# Patient Record
Sex: Male | Born: 1950 | Race: White | Hispanic: No | Marital: Married | Smoking: Former smoker
Health system: Southern US, Community
[De-identification: ages and names within clinical notes are randomized; demographics above are authoritative.]

## PROBLEM LIST (undated history)

## (undated) DIAGNOSIS — H9192 Unspecified hearing loss, left ear: Secondary | ICD-10-CM

## (undated) DIAGNOSIS — Z8042 Family history of malignant neoplasm of prostate: Secondary | ICD-10-CM

## (undated) DIAGNOSIS — M1990 Unspecified osteoarthritis, unspecified site: Secondary | ICD-10-CM

## (undated) DIAGNOSIS — K219 Gastro-esophageal reflux disease without esophagitis: Secondary | ICD-10-CM

## (undated) DIAGNOSIS — E785 Hyperlipidemia, unspecified: Secondary | ICD-10-CM

## (undated) DIAGNOSIS — Z800 Family history of malignant neoplasm of digestive organs: Secondary | ICD-10-CM

## (undated) DIAGNOSIS — I10 Essential (primary) hypertension: Secondary | ICD-10-CM

## (undated) DIAGNOSIS — C801 Malignant (primary) neoplasm, unspecified: Secondary | ICD-10-CM

## (undated) DIAGNOSIS — Z801 Family history of malignant neoplasm of trachea, bronchus and lung: Secondary | ICD-10-CM

## (undated) DIAGNOSIS — Z808 Family history of malignant neoplasm of other organs or systems: Secondary | ICD-10-CM

## (undated) HISTORY — DX: Family history of malignant neoplasm of prostate: Z80.42

## (undated) HISTORY — DX: Family history of malignant neoplasm of trachea, bronchus and lung: Z80.1

## (undated) HISTORY — DX: Family history of malignant neoplasm of digestive organs: Z80.0

## (undated) HISTORY — DX: Essential (primary) hypertension: I10

## (undated) HISTORY — DX: Hyperlipidemia, unspecified: E78.5

## (undated) HISTORY — DX: Family history of malignant neoplasm of other organs or systems: Z80.8

---

## 2006-06-03 HISTORY — PX: FINGER SURGERY: SHX640

## 2010-06-06 DIAGNOSIS — S64490A Injury of digital nerve of right index finger, initial encounter: Secondary | ICD-10-CM | POA: Insufficient documentation

## 2010-06-06 DIAGNOSIS — S67190A Crushing injury of right index finger, initial encounter: Secondary | ICD-10-CM | POA: Insufficient documentation

## 2010-07-31 DIAGNOSIS — G5600 Carpal tunnel syndrome, unspecified upper limb: Secondary | ICD-10-CM | POA: Insufficient documentation

## 2013-12-17 ENCOUNTER — Ambulatory Visit: Payer: Self-pay | Admitting: Internal Medicine

## 2015-08-03 ENCOUNTER — Encounter: Payer: Self-pay | Admitting: *Deleted

## 2015-08-14 ENCOUNTER — Ambulatory Visit (INDEPENDENT_AMBULATORY_CARE_PROVIDER_SITE_OTHER): Payer: Managed Care, Other (non HMO) | Admitting: General Surgery

## 2015-08-14 ENCOUNTER — Encounter: Payer: Self-pay | Admitting: General Surgery

## 2015-08-14 DIAGNOSIS — D1720 Benign lipomatous neoplasm of skin and subcutaneous tissue of unspecified limb: Secondary | ICD-10-CM

## 2015-08-14 DIAGNOSIS — Z8371 Family history of colonic polyps: Secondary | ICD-10-CM

## 2015-08-14 DIAGNOSIS — Z86010 Personal history of colonic polyps: Secondary | ICD-10-CM | POA: Diagnosis not present

## 2015-08-14 MED ORDER — POLYETHYLENE GLYCOL 3350 17 GM/SCOOP PO POWD: ORAL | Status: DC

## 2015-08-14 NOTE — Progress Notes (Signed)
Patient ID: Lee Graham, male   DOB: 08/16/1950, 65 y.o.   MRN: [id number]  Chief Complaint  Patient presents with  . Colonoscopy    HPI Lee Graham is a 65 y.o. male here today for a evaluation of colonoscopy. Last colonoscopy was done 8 years ago with a finding of adenomatous polyps. Patient states no GI problems at this time. Current health conditions include high cholesterol and heart issues. Family history includes colon cancer in mother and 2 brothers. Father had prostate cancer. Oldest brother had facial cancer.  HPI I have reviewed the history of present illness with the patient.   Past Medical History  Diagnosis Date  . Hyperlipidemia   . Hypertension     History reviewed. No pertinent past surgical history.  Family History  Problem Relation Age of Onset  . Colon cancer Father     Social History Social History  Substance Use Topics  . Smoking status: Never Smoker   . Smokeless tobacco: None  . Alcohol Use: No    No Known Allergies  Current Outpatient Prescriptions  Medication Sig Dispense Refill  . aspirin 81 MG tablet Take 81 mg by mouth daily.    . metoprolol succinate (TOPROL-XL) 100 MG 24 hr tablet     . simvastatin (ZOCOR) 40 MG tablet     . telmisartan (MICARDIS) 40 MG tablet     . polyethylene glycol powder (GLYCOLAX/MIRALAX) powder 255 grams one bottle for colonoscopy prep 255 g 0   No current facility-administered medications for this visit.    Review of Systems Review of Systems  Constitutional: Negative.   Respiratory: Negative.   Cardiovascular: Negative.   Gastrointestinal: Negative.     Blood pressure 120/70, pulse 68, resp. rate 12, height 6\' 2"  (1.88 m), weight 229 lb (103.874 kg).  Physical Exam Physical Exam  Constitutional: He is oriented to person, place, and time. He appears well-developed and well-nourished.  Eyes: Conjunctivae are normal. No scleral icterus.  Neck: Neck supple.  Cardiovascular: Normal rate,  regular rhythm and normal heart sounds.   Pulmonary/Chest: Effort normal and breath sounds normal.    Abdominal: Soft. Bowel sounds are normal. There is no hepatomegaly. There is no tenderness. No hernia.  Lymphadenopathy:    He has no cervical adenopathy.  Neurological: He is alert and oriented to person, place, and time.  Skin: Skin is warm and dry.  Psychiatric: His behavior is normal.    Data Reviewed 2009 colonoscopy reviewed.  Assessment    Stable exam. 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 Pt reports this has been there for years and has grown slowly.     Plan    Colonoscopy with possible biopsy/polypectomy prn: Information regarding the procedure, including its potential risks and complications (including but not limited to perforation of the bowel, which may require emergency surgery to repair, and bleeding) was verbally given to the patient. Educational information regarding lower intestinal endoscopy was given to the patient. Written instructions for how to complete the bowel prep using Miralax were provided. The importance of drinking ample fluids to avoid dehydration as a result of the prep emphasized.     Patient has been scheduled for a colonoscopy on 08-30-15 at [facility]. It is okay for patient to continue 81 mg aspirin once daily.   Also, patient has been scheduled for an excision of the right shoulder on 10-09-15 at [facility]. Patient may continue 81 mg aspirin once daily.     Roosvelt Churchwell G 08/14/2015, 1:16  PM

## 2015-08-14 NOTE — Patient Instructions (Addendum)
Colonoscopy A colonoscopy is an exam to look at the entire large intestine (colon). This exam can help find problems such as tumors, polyps, inflammation, and areas of bleeding. The exam takes about 1 hour.  LET [facility] CARE PROVIDER KNOW ABOUT:   Any allergies you have.  All medicines you are taking, including vitamins, herbs, eye drops, creams, and over-the-counter medicines.  Previous problems you or members of your family have had with the use of anesthetics.  Any blood disorders you have.  Previous surgeries you have had.  Medical conditions you have. RISKS AND COMPLICATIONS  Generally, this is a safe procedure. However, as with any procedure, complications can occur. Possible complications include:  Bleeding.  Tearing or rupture of the colon wall.  Reaction to medicines given during the exam.  Infection (rare). BEFORE THE PROCEDURE   Ask your health care provider about changing or stopping your regular medicines.  You may be prescribed an oral bowel prep. This involves drinking a large amount of medicated liquid, starting the day before your procedure. The liquid will cause you to have multiple loose stools until your stool is almost clear or light green. This cleans out your colon in preparation for the procedure.  Do not eat or drink anything else once you have started the bowel prep, unless your health care provider tells you it is safe to do so.  Arrange for someone to drive you home after the procedure. PROCEDURE   You will be given medicine to help you relax (sedative).  You will lie on your side with your knees bent.  A long, flexible tube with a light and camera on the end (colonoscope) will be inserted through the rectum and into the colon. The camera sends video back to a computer screen as it moves through the colon. The colonoscope also releases carbon dioxide gas to inflate the colon. This helps your health care provider see the area better.  During  the exam, your health care provider may take a small tissue sample (biopsy) to be examined under a microscope if any abnormalities are found.  The exam is finished when the entire colon has been viewed. AFTER THE PROCEDURE   Do not drive for 24 hours after the exam.  You may have a small amount of blood in your stool.  You may pass moderate amounts of gas and have mild abdominal cramping or bloating. This is caused by the gas used to inflate your colon during the exam.  Ask when your test results will be ready and how you will get your results. Make sure you get your test results.   This information is not intended to replace advice given to you by your health care provider. Make sure you discuss any questions you have with your health care provider.   Document Released: 05/17/2000 Document Revised: 03/10/2013 Document Reviewed: 01/25/2013 Elsevier Interactive Patient Education [employer].  Patient has been scheduled for a colonoscopy on 08-30-15 at [facility]. It is okay for patient to continue 81 mg aspirin once daily.   Also, patient has been scheduled for an excision of the right shoulder on 10-09-15 at [facility]. Patient may continue 81 mg aspirin once daily.

## 2015-08-29 ENCOUNTER — Encounter: Payer: Self-pay | Admitting: *Deleted

## 2015-08-30 ENCOUNTER — Ambulatory Visit: Payer: Managed Care, Other (non HMO) | Admitting: Anesthesiology

## 2015-08-30 ENCOUNTER — Encounter: Payer: Self-pay | Admitting: Anesthesiology

## 2015-08-30 ENCOUNTER — Ambulatory Visit
Admission: RE | Admit: 2015-08-30 | Discharge: 2015-08-30 | Disposition: A | Discharge status: Home / Self Care | Payer: Managed Care, Other (non HMO) | Source: Ambulatory Visit | Attending: General Surgery | Admitting: General Surgery

## 2015-08-30 ENCOUNTER — Encounter
Admission: RE | Disposition: A | Discharge status: Home / Self Care | Payer: Self-pay | Source: Ambulatory Visit | Attending: General Surgery

## 2015-08-30 DIAGNOSIS — R2231 Localized swelling, mass and lump, right upper limb: Secondary | ICD-10-CM | POA: Diagnosis not present

## 2015-08-30 DIAGNOSIS — I10 Essential (primary) hypertension: Secondary | ICD-10-CM | POA: Insufficient documentation

## 2015-08-30 DIAGNOSIS — Z86010 Personal history of colonic polyps: Secondary | ICD-10-CM | POA: Insufficient documentation

## 2015-08-30 DIAGNOSIS — Z7982 Long term (current) use of aspirin: Secondary | ICD-10-CM | POA: Insufficient documentation

## 2015-08-30 DIAGNOSIS — E7800 Pure hypercholesterolemia, unspecified: Secondary | ICD-10-CM | POA: Diagnosis not present

## 2015-08-30 DIAGNOSIS — Z1211 Encounter for screening for malignant neoplasm of colon: Secondary | ICD-10-CM | POA: Diagnosis not present

## 2015-08-30 DIAGNOSIS — Z800 Family history of malignant neoplasm of digestive organs: Secondary | ICD-10-CM | POA: Insufficient documentation

## 2015-08-30 DIAGNOSIS — Z8042 Family history of malignant neoplasm of prostate: Secondary | ICD-10-CM | POA: Diagnosis not present

## 2015-08-30 DIAGNOSIS — K219 Gastro-esophageal reflux disease without esophagitis: Secondary | ICD-10-CM | POA: Diagnosis not present

## 2015-08-30 DIAGNOSIS — E785 Hyperlipidemia, unspecified: Secondary | ICD-10-CM | POA: Diagnosis not present

## 2015-08-30 DIAGNOSIS — Z79899 Other long term (current) drug therapy: Secondary | ICD-10-CM | POA: Insufficient documentation

## 2015-08-30 HISTORY — PX: COLONOSCOPY WITH PROPOFOL: SHX5780

## 2015-08-30 SURGERY — COLONOSCOPY WITH PROPOFOL
Anesthesia: General

## 2015-08-30 MED ORDER — PROPOFOL 10 MG/ML IV BOLUS
INTRAVENOUS | Status: DC | PRN
  Administered 2015-08-30: 70 mg via INTRAVENOUS

## 2015-08-30 MED ORDER — SODIUM CHLORIDE 0.9 % IV SOLN
INTRAVENOUS | Status: DC
  Administered 2015-08-30: 1000 mL via INTRAVENOUS

## 2015-08-30 MED ORDER — PROPOFOL 500 MG/50ML IV EMUL
INTRAVENOUS | Status: DC | PRN
  Administered 2015-08-30: 125 ug/kg/min via INTRAVENOUS

## 2015-08-30 MED ORDER — LIDOCAINE HCL (PF) 2 % IJ SOLN
INTRAMUSCULAR | Status: DC | PRN
  Administered 2015-08-30: 80 mg via INTRADERMAL

## 2015-08-30 NOTE — H&P (View-Only) (Signed)
Patient ID: Lee Graham, male   DOB: 26-Sep-1950, 65 y.o.   MRN: [id number]  Chief Complaint  Patient presents with  . Colonoscopy    HPI Lee Graham is a 65 y.o. male here today for a evaluation of colonoscopy. Last colonoscopy was done 8 years ago with a finding of adenomatous polyps. Patient states no GI problems at this time. Current health conditions include high cholesterol and heart issues. Family history includes colon cancer in mother and 2 brothers. Father had prostate cancer. Oldest brother had facial cancer.  HPI I have reviewed the history of present illness with the patient.   Past Medical History  Diagnosis Date  . Hyperlipidemia   . Hypertension     History reviewed. No pertinent past surgical history.  Family History  Problem Relation Age of Onset  . Colon cancer Father     Social History Social History  Substance Use Topics  . Smoking status: Never Smoker   . Smokeless tobacco: None  . Alcohol Use: No    No Known Allergies  Current Outpatient Prescriptions  Medication Sig Dispense Refill  . aspirin 81 MG tablet Take 81 mg by mouth daily.    . metoprolol succinate (TOPROL-XL) 100 MG 24 hr tablet     . simvastatin (ZOCOR) 40 MG tablet     . telmisartan (MICARDIS) 40 MG tablet     . polyethylene glycol powder (GLYCOLAX/MIRALAX) powder 255 grams one bottle for colonoscopy prep 255 g 0   No current facility-administered medications for this visit.    Review of Systems Review of Systems  Constitutional: Negative.   Respiratory: Negative.   Cardiovascular: Negative.   Gastrointestinal: Negative.     Blood pressure 120/70, pulse 68, resp. rate 12, height 6\' 2"  (1.88 m), weight 229 lb (103.874 kg).  Physical Exam Physical Exam  Constitutional: He is oriented to person, place, and time. He appears well-developed and well-nourished.  Eyes: Conjunctivae are normal. No scleral icterus.  Neck: Neck supple.  Cardiovascular: Normal rate,  regular rhythm and normal heart sounds.   Pulmonary/Chest: Effort normal and breath sounds normal.    Abdominal: Soft. Bowel sounds are normal. There is no hepatomegaly. There is no tenderness. No hernia.  Lymphadenopathy:    He has no cervical adenopathy.  Neurological: He is alert and oriented to person, place, and time.  Skin: Skin is warm and dry.  Psychiatric: His behavior is normal.    Data Reviewed 2009 colonoscopy reviewed.  Assessment    Stable exam. 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 Pt reports this has been there for years and has grown slowly.     Plan    Colonoscopy with possible biopsy/polypectomy prn: Information regarding the procedure, including its potential risks and complications (including but not limited to perforation of the bowel, which may require emergency surgery to repair, and bleeding) was verbally given to the patient. Educational information regarding lower intestinal endoscopy was given to the patient. Written instructions for how to complete the bowel prep using Miralax were provided. The importance of drinking ample fluids to avoid dehydration as a result of the prep emphasized.     Patient has been scheduled for a colonoscopy on 08-30-15 at [facility]. It is okay for patient to continue 81 mg aspirin once daily.   Also, patient has been scheduled for an excision of the right shoulder on 10-09-15 at [facility]. Patient may continue 81 mg aspirin once daily.     Kyle Stansell G 08/14/2015, 1:16  PM

## 2015-08-30 NOTE — Anesthesia Postprocedure Evaluation (Signed)
Anesthesia Post Note  Patient: Lee Graham  Procedure(s) Performed: Procedure(s) (LRB): COLONOSCOPY WITH PROPOFOL (N/A)  Patient location during evaluation: Endoscopy Anesthesia Type: General Level of consciousness: awake and alert Pain management: pain level controlled Vital Signs Assessment: post-procedure vital signs reviewed and stable Respiratory status: spontaneous breathing, nonlabored ventilation, respiratory function stable and patient connected to nasal cannula oxygen Cardiovascular status: blood pressure returned to baseline and stable Postop Assessment: no signs of nausea or vomiting Anesthetic complications: no    Last Vitals:  Filed Vitals:   08/30/15 1220 08/30/15 1229  BP:  127/61  Pulse: 61 52  Temp:    Resp: 20 13    Last Pain: There were no vitals filed for this visit.               Precious Haws Piscitello

## 2015-08-30 NOTE — Op Note (Signed)
[facility] Gastroenterology Patient Name: Lee Graham Procedure Date: 08/30/2015 11:38 AM MRN: [id number] Account #: [id number] Date of Birth: March 28, 1951 Admit Type: Outpatient Age: 65 Room: [facility] ENDO ROOM 1 Gender: Male Note Status: Finalized Procedure:            Colonoscopy Indications:          Family history of colon cancer in a first-degree                        relative Providers:            Seeplaputhur G. Jamal Collin, MD Referring MD:         Rusty Aus, MD (Referring MD) Medicines:            General Anesthesia Complications:        No immediate complications. Procedure:            Pre-Anesthesia Assessment:                       - General anesthesia under the supervision of an                        anesthesiologist was determined to be medically                        necessary for this procedure based on review of the                        patient's medical history, medications, and prior                        anesthesia history.                       After obtaining informed consent, the colonoscope was                        passed under direct vision. Throughout the procedure,                        the patient's blood pressure, pulse, and oxygen                        saturations were monitored continuously. The                        Colonoscope was introduced through the anus and                        advanced to the the cecum, identified by the ileocecal                        valve. The colonoscopy was performed without                        difficulty. The patient tolerated the procedure well.                        The quality of the bowel preparation was good. Findings:      The entire examined colon appeared normal on direct and retroflexion  views. Impression:           - The entire examined colon is normal on direct and                        retroflexion views.                       - No specimens  collected. Recommendation:       - Discharge patient to home.                       - Repeat colonoscopy in 5 years for surveillance. Procedure Code(s):    --- Professional ---                       [phone], Colonoscopy, flexible; diagnostic, including                        collection of specimen(s) by brushing or washing, when                        performed (separate procedure) Diagnosis Code(s):    --- Professional ---                       Z80.0, Family history of malignant neoplasm of                        digestive organs CPT copyright 2016 American Medical Association. All rights reserved. The codes documented in this report are preliminary and upon coder review may  be revised to meet current compliance requirements. Christene Lye, MD 08/30/2015 12:05:36 PM This report has been signed electronically. Number of Addenda: 0 Note Initiated On: 08/30/2015 11:38 AM Scope Withdrawal Time: 0 hours 4 minutes 44 seconds  Total Procedure Duration: 0 hours 12 minutes 45 seconds       [facility]

## 2015-08-30 NOTE — Anesthesia Preprocedure Evaluation (Signed)
Anesthesia Evaluation  Patient identified by MRN, date of birth, ID band Patient awake    Reviewed: Allergy & Precautions, H&P , NPO status , Patient's Chart, lab work & pertinent test results  History of Anesthesia Complications Negative for: history of anesthetic complications  Airway Mallampati: III  TM Distance: >3 FB Neck ROM: limited    Dental  (+) Poor Dentition, Chipped, Missing, Partial Lower, Partial Upper   Pulmonary neg pulmonary ROS, neg shortness of breath,    Pulmonary exam normal breath sounds clear to auscultation       Cardiovascular Exercise Tolerance: Good hypertension, (-) angina(-) Past MI and (-) DOE Normal cardiovascular exam Rhythm:regular Rate:Normal     Neuro/Psych negative neurological ROS  negative psych ROS   GI/Hepatic Neg liver ROS, GERD  Controlled,  Endo/Other  negative endocrine ROS  Renal/GU negative Renal ROS  negative genitourinary   Musculoskeletal   Abdominal   Peds  Hematology negative hematology ROS (+)   Anesthesia Other Findings Past Medical History:   Hyperlipidemia                                               Hypertension                                                History reviewed. No pertinent surgical history.  BMI    Body Mass Index   27.87 kg/m 2    Signs and symptoms suggestive of sleep apnea    Reproductive/Obstetrics negative OB ROS                             Anesthesia Physical Anesthesia Plan  ASA: III  Anesthesia Plan: General   Post-op Pain Management:    Induction:   Airway Management Planned:   Additional Equipment:   Intra-op Plan:   Post-operative Plan:   Informed Consent: I have reviewed the patients History and Physical, chart, labs and discussed the procedure including the risks, benefits and alternatives for the proposed anesthesia with the patient or authorized representative who has indicated  his/her understanding and acceptance.   Dental Advisory Given  Plan Discussed with: Anesthesiologist, CRNA and Surgeon  Anesthesia Plan Comments:         Anesthesia Quick Evaluation

## 2015-08-30 NOTE — Transfer of Care (Signed)
Immediate Anesthesia Transfer of Care Note  Patient: Lee Graham  Procedure(s) Performed: Procedure(s): COLONOSCOPY WITH PROPOFOL (N/A)  Patient Location: PACU  Anesthesia Type:General  Level of Consciousness: sedated and responds to stimulation  Airway & Oxygen Therapy: Patient Spontanous Breathing and Patient connected to nasal cannula oxygen  Post-op Assessment: Report given to RN and Post -op Vital signs reviewed and stable  Post vital signs: Reviewed and stable  Last Vitals:  Filed Vitals:   08/30/15 1010 08/30/15 1206  BP: 115/66 124/68  Pulse: 57 63  Temp: 36.4 C   Resp: 16 13    Complications: No apparent anesthesia complications

## 2015-08-30 NOTE — Interval H&P Note (Signed)
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 After consideration of risks, benefits and other options for treatment, the patient has consented to  Procedure(s): COLONOSCOPY WITH PROPOFOL (N/A) as a surgical intervention .  The patient's history has been reviewed, patient examined, no change in status, stable for surgery.  I have reviewed the patient's chart and labs.  Questions were answered to the patient's satisfaction.     SANKAR,SEEPLAPUTHUR G

## 2015-09-01 ENCOUNTER — Encounter: Payer: Self-pay | Admitting: General Surgery

## 2015-09-21 ENCOUNTER — Other Ambulatory Visit: Payer: Self-pay | Admitting: General Surgery

## 2015-09-21 DIAGNOSIS — D1720 Benign lipomatous neoplasm of skin and subcutaneous tissue of unspecified limb: Secondary | ICD-10-CM

## 2015-09-29 ENCOUNTER — Other Ambulatory Visit: Payer: Self-pay

## 2015-09-29 ENCOUNTER — Encounter: Payer: Self-pay | Admitting: *Deleted

## 2015-09-29 NOTE — Patient Instructions (Signed)
  Your procedure is scheduled on: 10-09-15  Report to [place] To find out your arrival time please call [phone] between 1PM - 3PM on 10-06-15  Remember: Instructions that are not followed completely may result in serious medical risk, up to and including death, or upon the discretion of your surgeon and anesthesiologist your surgery may need to be rescheduled.    _X___ 1. Do not eat food or drink liquids after midnight. No gum chewing or hard candies.     _X___ 2. No Alcohol for 24 hours before or after surgery.   ____ 3. Bring all medications with you on the day of surgery if instructed.    ____ 4. Notify your doctor if there is any change in your medical condition     (cold, fever, infections).     Do not wear jewelry, make-up, hairpins, clips or nail polish.  Do not wear lotions, powders, or perfumes. You may wear deodorant.  Do not shave 48 hours prior to surgery. Men may shave face and neck.  Do not bring valuables to the hospital.    [facility] is not responsible for any belongings or valuables.               Contacts, dentures or bridgework may not be worn into surgery.  Leave your suitcase in the car. After surgery it may be brought to your room.  For patients admitted to the hospital, discharge time is determined by your  treatment team.   Patients discharged the day of surgery will not be allowed to drive home.   Please read over the following fact sheets that you were given:      _X___ Take these medicines the morning of surgery with A SIP OF WATER:    1. MICARDIS  2. METOPROLOL  3.   4.  5.  6.  ____ Fleet Enema (as directed)   ____ Use CHG Soap as directed  ____ Use inhalers on the day of surgery  ____ Stop metformin 2 days prior to surgery    ____ Take 1/2 of usual insulin dose the night before surgery and none on the morning of surgery.   ____ Stop Coumadin/Plavix/aspirin-OK TO CONTINUE 81 MG ASA PER DR Jamal Collin  ____  Stop Anti-inflammatories-NO NSAIDS OR ASA PRODUCTS-TYLENOL OK TO TAKE   ____ Stop supplements until after surgery.    ____ Bring C-Pap to the hospital.

## 2015-10-03 NOTE — Pre-Procedure Instructions (Signed)
PTS WIFE CALLED BACK SO I COULD GO OVER THE INSTRUCTIONS FOR SURGERY WITH HER.  I INFORMED HER OF THE MEDS PT NEEDS TO TAKE AM OF SURGERY ALONG WITH NPO AFTER MN Sunday NIGHT.  I TOLD PTS WIFE THAT HE HAD TOLD ME HE HAD AN EKG DONE AT DR TATES OFFICE LAST YEAR BUT I CALLED THE OFFICE AND THEY SAID THEY DID NOT DO AN EKG LAST YEAR.  I ASKED PTS WIFE IF HE COULD COME IN THIS WEEK FOR AN EKG AND SHE SAID HE IS WORKING OUT OF [place] AND WILL NOT BE BACK UNTIL THIS WEEKEND. I  TOLD WIFE TO INFORM HER HUSBAND THAT WE WILL HAVE TO DO AN EKG AM OF SURGERY

## 2015-10-05 ENCOUNTER — Telehealth: Payer: Self-pay | Admitting: *Deleted

## 2015-10-05 NOTE — Telephone Encounter (Signed)
Patient's wife called the office to report that patient wishes to cancel his surgery for Monday, 10-09-15 at [facility].   Patient wishes to cancel because he is not happy that the [facility] has called the patient wanting money and wanting credit card information. Patient's wife states they will not be providing a credit card number to them over the phone.   Leah in the [place] been notified of cancellation.

## 2015-10-09 ENCOUNTER — Ambulatory Visit
Admission: RE | Admit: 2015-10-09 | Payer: Managed Care, Other (non HMO) | Source: Ambulatory Visit | Admitting: General Surgery

## 2015-10-09 HISTORY — DX: Unspecified hearing loss, left ear: H91.92

## 2015-10-09 HISTORY — DX: Unspecified osteoarthritis, unspecified site: M19.90

## 2015-10-09 SURGERY — EXCISION MASS UPPER EXTREMITIES
Anesthesia: Choice | Laterality: Right

## 2016-10-01 ENCOUNTER — Other Ambulatory Visit: Payer: Self-pay | Admitting: Otolaryngology

## 2016-10-01 DIAGNOSIS — H9123 Sudden idiopathic hearing loss, bilateral: Secondary | ICD-10-CM

## 2016-10-01 DIAGNOSIS — H903 Sensorineural hearing loss, bilateral: Secondary | ICD-10-CM

## 2016-10-08 ENCOUNTER — Ambulatory Visit: Admission: RE | Admit: 2016-10-08 | Payer: Medicare Other | Source: Ambulatory Visit | Admitting: Otolaryngology

## 2016-10-08 ENCOUNTER — Other Ambulatory Visit: Payer: Self-pay | Admitting: Otolaryngology

## 2016-10-08 ENCOUNTER — Ambulatory Visit
Admission: RE | Admit: 2016-10-08 | Discharge: 2016-10-08 | Disposition: A | Discharge status: Home / Self Care | Payer: Medicare Other | Source: Ambulatory Visit | Attending: Otolaryngology | Admitting: Otolaryngology

## 2016-10-08 DIAGNOSIS — Z1389 Encounter for screening for other disorder: Secondary | ICD-10-CM

## 2016-10-08 DIAGNOSIS — Z01818 Encounter for other preprocedural examination: Secondary | ICD-10-CM | POA: Diagnosis present

## 2016-10-10 ENCOUNTER — Ambulatory Visit
Admission: RE | Admit: 2016-10-10 | Discharge: 2016-10-10 | Disposition: A | Discharge status: Home / Self Care | Payer: Medicare Other | Source: Ambulatory Visit | Attending: Otolaryngology | Admitting: Otolaryngology

## 2016-10-10 DIAGNOSIS — H903 Sensorineural hearing loss, bilateral: Secondary | ICD-10-CM | POA: Diagnosis present

## 2016-10-10 DIAGNOSIS — H9123 Sudden idiopathic hearing loss, bilateral: Secondary | ICD-10-CM | POA: Diagnosis not present

## 2016-10-10 MED ORDER — GADOBENATE DIMEGLUMINE 529 MG/ML IV SOLN
19.0000 mL | Freq: Once | INTRAVENOUS | Status: AC | PRN
  Administered 2016-10-10: 19 mL via INTRAVENOUS

## 2016-10-16 ENCOUNTER — Encounter: Payer: Self-pay | Admitting: Urology

## 2016-10-16 ENCOUNTER — Ambulatory Visit (INDEPENDENT_AMBULATORY_CARE_PROVIDER_SITE_OTHER): Payer: Medicare Other | Admitting: Urology

## 2016-10-16 VITALS — BP 148/72 | HR 80 | Ht 74.0 in | Wt 225.0 lb

## 2016-10-16 DIAGNOSIS — R350 Frequency of micturition: Secondary | ICD-10-CM

## 2016-10-16 DIAGNOSIS — E785 Hyperlipidemia, unspecified: Secondary | ICD-10-CM | POA: Insufficient documentation

## 2016-10-16 DIAGNOSIS — N402 Nodular prostate without lower urinary tract symptoms: Secondary | ICD-10-CM

## 2016-10-16 DIAGNOSIS — R972 Elevated prostate specific antigen [PSA]: Secondary | ICD-10-CM

## 2016-10-16 DIAGNOSIS — I10 Essential (primary) hypertension: Secondary | ICD-10-CM | POA: Insufficient documentation

## 2016-10-16 NOTE — Progress Notes (Signed)
10/16/2016 9:53 AM   Lee Graham 1951-03-27 [id number]  Referring provider: Albina Billet, MD [address]   [place], [place] [address]  Chief Complaint  Patient presents with  . Elevated PSA    New Patient    HPI: 66 year old male referred for further evaluation of elevated PSA.  His most recent PSA drawn on 10/07/2016 was 10.7. His free PSA was 1.07, percent free PSA 9.8% which is considered high risk category.  Prior to this, his PSA was 5.6 collection on 07/21/2015. His free PSA was also elevated at that time.  He reports that he is been told his PSAs elevated in the past and on repeat check, supposed with come back down to normal. Were not provided with any of these normal range values.  His father may have been diagnosed with prostate cancer in his 75s. He died at age 85 from cardiac issues. He was never treated for his prostate cancer.  He is fairly healthy and takes no anticoagulants.  He does hake ASA 81 mg for prevention.    He is recently been on prednisone which is made thirsty. Since then, he's had increased urinary frequency, urgency, nocturia. Prior to being on prednisone, he has no urinary complaints. He gets up 0-1 times to void. No urinary urgency, frequency, weak stream, or incomplete bladder emptying. No UTIs. No gross hematuria.   PMH: Past Medical History:  Diagnosis Date  . Arthritis   . Deafness in left ear    ONLY 30% HEARING IN RIGHT EAR  . Hyperlipidemia   . Hypertension     Surgical History: Past Surgical History:  Procedure Laterality Date  . COLONOSCOPY WITH PROPOFOL N/A 08/30/2015   Procedure: COLONOSCOPY WITH PROPOFOL;  Surgeon: Christene Lye, MD;  Location: [facility] ENDOSCOPY;  Service: Endoscopy;  Laterality: N/A;  . FINGER SURGERY Right     Home Medications:  Allergies as of 10/16/2016   No Known Allergies     Medication List       Accurate as of 10/16/16  9:53 AM. Always use your most recent med list.          aspirin 81 MG tablet Take 81 mg by mouth daily.   metoprolol succinate 100 MG 24 hr tablet Commonly known as:  TOPROL-XL Take 100 mg by mouth every morning.   ROLAIDS PO Take 1 tablet by mouth as needed.   simvastatin 40 MG tablet Commonly known as:  ZOCOR Take 40 mg by mouth daily at 6 PM.   telmisartan 40 MG tablet Commonly known as:  MICARDIS Take 40 mg by mouth every morning.   triamterene-hydrochlorothiazide 37.5-25 MG capsule Commonly known as:  DYAZIDE       Allergies: No Known Allergies  Family History: Family History  Problem Relation Age of Onset  . Colon cancer Father   . Prostate cancer Neg Hx   . Bladder Cancer Neg Hx   . Kidney cancer Neg Hx     Social History:  reports that he quit smoking about 46 years ago. His smoking use included Cigars. He has quit using smokeless tobacco. He reports that he does not drink alcohol or use drugs.  ROS: UROLOGY Frequent Urination?: Yes Hard to postpone urination?: No Burning/pain with urination?: No Get up at night to urinate?: Yes Leakage of urine?: No Urine stream starts and stops?: No Trouble starting stream?: No Do you have to strain to urinate?: No Blood in urine?: No Urinary tract infection?: No Sexually  transmitted disease?: No Injury to kidneys or bladder?: No Painful intercourse?: No Weak stream?: No Erection problems?: Yes Penile pain?: No  Gastrointestinal Nausea?: No Vomiting?: No Indigestion/heartburn?: No Diarrhea?: No Constipation?: No  Constitutional Fever: No Night sweats?: No Weight loss?: No Fatigue?: No  Skin Skin rash/lesions?: No Itching?: No  Eyes Blurred vision?: No Double vision?: No  Ears/Nose/Throat Sore throat?: No Sinus problems?: No  Hematologic/Lymphatic Swollen glands?: No Easy bruising?: No  Cardiovascular Leg swelling?: No Chest pain?: No  Respiratory Cough?: No Shortness of breath?: No  Endocrine Excessive thirst?:  No  Musculoskeletal Back pain?: No Joint pain?: No  Neurological Headaches?: No Dizziness?: No  Psychologic Depression?: No Anxiety?: No  Physical Exam: BP (!) 148/72   Pulse 80   Ht 6\' 2"  (1.88 m)   Wt 225 lb (102.1 kg)   BMI 28.89 kg/m   Constitutional:  Alert and oriented, No acute distress.  Accompanied by wife today. HEENT: [place] AT, moist mucus membranes.  Trachea midline, no masses. During hearing aids. Heart appearing. Cardiovascular: No clubbing, cyanosis, or edema. Respiratory: Normal respiratory effort, no increased work of breathing. GI: Abdomen is soft, nontender, nondistended, no abdominal masses.  Small umbilical hernia. GU: No CVA tenderness. Circumcised phallus, normal testicles. Skin: No rashes, bruises or suspicious lesions. Rectal exam: Normal sphincter tone.  Enlarged 50 cc prostate, possible nodule at right apex. Lymph: No cervical or inguinal adenopathy. Neurologic: Grossly intact, no focal deficits, moving all 4 extremities. Psychiatric: Normal mood and affect.  Laboratory Data: PSA as above  Urinalysis N/A  Pertinent Imaging: N/A  Assessment & Plan:    1. Elevated PSA  We reviewed the implications of an elevated PSA and the uncertainty surrounding it. In general, a man's PSA increases with age and is produced by both normal and cancerous prostate tissue. Differential for elevated PSA is BPH, prostate cancer, infection, recent intercourse/ejaculation, prostate infarction, recent urethroscopic manipulation (foley placement/cystoscopy) and prostatitis. Management of an elevated PSA can include observation or prostate biopsy and wediscussed this in detail.  We discussed that indications for prostate biopsy are defined by age and race specific PSA cutoffs as well as a PSA velocity of 0.75/year.  Given his age, overall health, and quite significantly elevated PSA along with suspicious prostate exam today, I recommended proceeding with biopsy. She is  agreeable to this plan.  We discussed prostate biopsy in detail including the procedure itself, the risks of blood in the urine, stool, and ejaculate, serious infection, and discomfort. He is willing to proceed with this as discussed.  2. Prostate nodule Somewhat spicious exam today- as above  3. Urinary frequency Related to polydipsia-should improve now that pregnancy is completed   Return in about 2 weeks (around 10/30/2016), or prostate biopsy.  Hollice Espy, MD  [facility] [address] [place], [place] [address] [phone]

## 2016-10-17 ENCOUNTER — Other Ambulatory Visit: Payer: Self-pay

## 2016-10-24 ENCOUNTER — Other Ambulatory Visit: Payer: Self-pay

## 2016-11-01 ENCOUNTER — Ambulatory Visit (INDEPENDENT_AMBULATORY_CARE_PROVIDER_SITE_OTHER): Payer: Medicare Other | Admitting: Urology

## 2016-11-01 ENCOUNTER — Other Ambulatory Visit: Payer: Self-pay | Admitting: Urology

## 2016-11-01 VITALS — BP 133/77 | HR 86 | Ht 74.0 in | Wt 234.0 lb

## 2016-11-01 DIAGNOSIS — N402 Nodular prostate without lower urinary tract symptoms: Secondary | ICD-10-CM

## 2016-11-01 DIAGNOSIS — R972 Elevated prostate specific antigen [PSA]: Secondary | ICD-10-CM | POA: Diagnosis not present

## 2016-11-01 MED ORDER — GENTAMICIN SULFATE 40 MG/ML IJ SOLN
80.0000 mg | Freq: Once | INTRAMUSCULAR | Status: AC
  Administered 2016-11-01: 80 mg via INTRAMUSCULAR

## 2016-11-01 MED ORDER — LEVOFLOXACIN 500 MG PO TABS
500.0000 mg | ORAL_TABLET | Freq: Once | ORAL | Status: AC
  Administered 2016-11-01: 500 mg via ORAL

## 2016-11-03 NOTE — Progress Notes (Signed)
   11/01/16  CC:  Chief Complaint  Patient presents with  . Prostate Biopsy    HPI: 66 year old male with a elevated PSA to 10.7.  Possible nodule was populated the right apex. He presents today for prostate biopsy.  Blood pressure 133/77, pulse 86, height 6\' 2"  (1.88 m), weight 234 lb (106.1 kg). NED. A&Ox3.   No respiratory distress    Prostate Biopsy Procedure   Informed consent was obtained after discussing risks/benefits of the procedure.  A time out was performed to ensure correct patient identity.  Pre-Procedure: - Last PSA Level: No results found for: PSA - Gentamicin given prophylactically - Levaquin 500 mg administered PO -Transrectal Ultrasound performed revealing a 67 gm prostate -Significant abnormalities on ultrasound were identified including slight intravesical protrusion with a small median lobe as well as several hypoechoic lesions particularly on the patient's left mid gland but also distributed throughout the prostate, highly concerning  Procedure: - Prostate block performed using 10 cc 1% lidocaine and biopsies taken from sextant areas, a total of 12 under ultrasound guidance.  Post-Procedure: - Patient tolerated the procedure well  Assessment/ Plan:  1. Elevated PSA - He was counseled to seek immediate medical attention if experiences any severe pain, significant bleeding, or fevers - Return in one week to discuss biopsy results  - gentamicin (GARAMYCIN) injection 80 mg; Inject 2 mLs (80 mg total) into the muscle once. - levofloxacin (LEVAQUIN) tablet 500 mg; Take 1 tablet (500 mg total) by mouth once.  2. Prostate nodule As above   Hollice Espy, MD

## 2016-11-07 ENCOUNTER — Other Ambulatory Visit: Payer: Self-pay | Admitting: Urology

## 2016-11-07 ENCOUNTER — Telehealth: Payer: Self-pay | Admitting: Urology

## 2016-11-07 DIAGNOSIS — C61 Malignant neoplasm of prostate: Secondary | ICD-10-CM

## 2016-11-07 LAB — PATHOLOGY REPORT

## 2016-11-07 NOTE — Telephone Encounter (Signed)
Biopsy results were reviewed by telephone today with the patient's wife. The patient is extremely hard of hearing and not able to hear our conversation.  She will pass along the message and is his official contact (per DPR).  Given his high-risk tumor, I recommended further workup with CT abdomen and pelvis with contrast as well as bone scan ASAP to help facilitate our conversation and decision making on how to intervene with his prostate cancer.  She reports that he will likely be hesitant to pursue these studies but we'll try to convince him. We'll go ahead and get him arranged.  Plan for follow-up next week as scheduled.  Hollice Espy, MD

## 2016-11-08 ENCOUNTER — Telehealth: Payer: Self-pay | Admitting: Urology

## 2016-11-08 NOTE — Telephone Encounter (Signed)
I am working on this, I have also given the patient's wife the information so that she can also call to try and get him scheduled prior to the app.  Thanks, Sharyn Lull

## 2016-11-12 ENCOUNTER — Encounter
Admission: RE | Admit: 2016-11-12 | Discharge: 2016-11-12 | Disposition: A | Discharge status: Home / Self Care | Payer: Medicare Other | Source: Ambulatory Visit | Attending: Urology | Admitting: Urology

## 2016-11-12 ENCOUNTER — Ambulatory Visit: Admission: RE | Admit: 2016-11-12 | Payer: Medicare Other | Source: Ambulatory Visit

## 2016-11-12 ENCOUNTER — Ambulatory Visit
Admission: RE | Admit: 2016-11-12 | Discharge: 2016-11-12 | Disposition: A | Discharge status: Home / Self Care | Payer: Medicare Other | Source: Ambulatory Visit | Attending: Urology | Admitting: Urology

## 2016-11-12 DIAGNOSIS — I700 Atherosclerosis of aorta: Secondary | ICD-10-CM | POA: Insufficient documentation

## 2016-11-12 DIAGNOSIS — C61 Malignant neoplasm of prostate: Secondary | ICD-10-CM | POA: Insufficient documentation

## 2016-11-12 DIAGNOSIS — K760 Fatty (change of) liver, not elsewhere classified: Secondary | ICD-10-CM | POA: Insufficient documentation

## 2016-11-12 DIAGNOSIS — M47896 Other spondylosis, lumbar region: Secondary | ICD-10-CM | POA: Insufficient documentation

## 2016-11-12 HISTORY — DX: Malignant (primary) neoplasm, unspecified: C80.1

## 2016-11-12 LAB — POCT I-STAT CREATININE: CREATININE: 1.4 mg/dL — AB (ref 0.61–1.24)

## 2016-11-12 MED ORDER — IOPAMIDOL (ISOVUE-300) INJECTION 61%
100.0000 mL | Freq: Once | INTRAVENOUS | Status: AC | PRN
  Administered 2016-11-12: 100 mL via INTRAVENOUS

## 2016-11-12 MED ORDER — TECHNETIUM TC 99M MEDRONATE IV KIT
25.0000 | PACK | Freq: Once | INTRAVENOUS | Status: AC | PRN
  Administered 2016-11-12: 24.52 via INTRAVENOUS

## 2016-11-13 ENCOUNTER — Ambulatory Visit (INDEPENDENT_AMBULATORY_CARE_PROVIDER_SITE_OTHER): Payer: Medicare Other | Admitting: Urology

## 2016-11-13 ENCOUNTER — Encounter: Payer: Self-pay | Admitting: Urology

## 2016-11-13 VITALS — BP 112/59 | HR 96 | Ht 74.0 in | Wt 230.0 lb

## 2016-11-13 DIAGNOSIS — C61 Malignant neoplasm of prostate: Secondary | ICD-10-CM | POA: Diagnosis not present

## 2016-11-13 DIAGNOSIS — C801 Malignant (primary) neoplasm, unspecified: Secondary | ICD-10-CM

## 2016-11-13 HISTORY — DX: Malignant (primary) neoplasm, unspecified: C80.1

## 2016-11-13 NOTE — Progress Notes (Signed)
11/13/2016 4:56 PM   Lanice Shirts 1950/06/22 [id number]  Referring provider: Albina Billet, MD [address]   [place], [place] [address]   HPI: 66 year old male who presents today to discuss his newly diagnosed prostate cancer.  He underwent prostate biopsy on 11/01/2016 for PSA of 10.7.  Rectal exam was also suspicious for nodularity at the right apex.  TRUS volume 67 gm.  there was evidence of slight intravesical protrusion with a small median lobe.  Prostate biopsy was positive for 8/12 lesions of prostate cancer, the most aggressive appearing tumor on the left lateral biopsies, Gleason 4+5 involving a total of 3 cores on the side involving up to 31%. The remainder of the cores were diffuse throughout the gland, Gleason 3+3.  He did undergo further staging in the form of CT abdomen pelvis with contrast as well as bone scan. These are both negative for any evidence of metastatic disease.  He is fairly healthy and takes no anticoagulants.  He does hake ASA 81 mg for prevention.   No previous abdominal surgeries.   BMI 29.    He does have severe baseline erectile dysfunction. IPSS and SHIM as below.  MSK nomogram today, overall survival 99%/98%, , progression free survival 32%/20%, organ confined disease 16%, extracapsular extension 82%, lymph nodes 28%, seminal vesicles 32%.      IPSS    Row Name 11/13/16 1300         International Prostate Symptom Score   How often have you had the sensation of not emptying your bladder? Not at All     How often have you had to urinate less than every two hours? About half the time     How often have you found you stopped and started again several times when you urinated? Less than 1 in 5 times     How often have you found it difficult to postpone urination? Less than 1 in 5 times     How often have you had a weak urinary stream? Less than half the time     How often have you had to strain to start urination? Not at All     How  many times did you typically get up at night to urinate? 2 Times     Total IPSS Score 9       Quality of Life due to urinary symptoms   If you were to spend the rest of your life with your urinary condition just the way it is now how would you feel about that? Mixed        Score:  1-7 Mild 8-19 Moderate 20-35 Severe      SHIM    Row Name 11/13/16 1327         SHIM: Over the last 6 months:   How do you rate your confidence that you could get and keep an erection? Very Low     When you had erections with sexual stimulation, how often were your erections hard enough for penetration (entering your partner)? Most Times (much more than half the time)     During sexual intercourse, how often were you able to maintain your erection after you had penetrated (entered) your partner? Most Times (much more than half the time)     During sexual intercourse, how difficult was it to maintain your erection to completion of intercourse? Slightly Difficult     When you attempted sexual intercourse, how often was it satisfactory for you?  Most Times (much more than half the time)       SHIM Total Score   SHIM 17         PMH: Past Medical History:  Diagnosis Date  . Arthritis   . Cancer ([place])   . Deafness in left ear    ONLY 30% HEARING IN RIGHT EAR  . Hyperlipidemia   . Hypertension     Surgical History: Past Surgical History:  Procedure Laterality Date  . COLONOSCOPY WITH PROPOFOL N/A 08/30/2015   Procedure: COLONOSCOPY WITH PROPOFOL;  Surgeon: Christene Lye, MD;  Location: [facility] ENDOSCOPY;  Service: Endoscopy;  Laterality: N/A;  . FINGER SURGERY Right     Home Medications:  Allergies as of 11/13/2016   No Known Allergies     Medication List       Accurate as of 11/13/16  4:56 PM. Always use your most recent med list.          aspirin 81 MG tablet Take 81 mg by mouth daily.   metoprolol succinate 100 MG 24 hr tablet Commonly known as:  TOPROL-XL Take 100 mg by  mouth every morning.   ROLAIDS PO Take 1 tablet by mouth as needed.   simvastatin 40 MG tablet Commonly known as:  ZOCOR Take 40 mg by mouth daily at 6 PM.   telmisartan 40 MG tablet Commonly known as:  MICARDIS Take 40 mg by mouth every morning.   triamterene-hydrochlorothiazide 37.5-25 MG capsule Commonly known as:  DYAZIDE       Allergies: No Known Allergies  Family History: Family History  Problem Relation Age of Onset  . Colon cancer Father   . Prostate cancer Neg Hx   . Bladder Cancer Neg Hx   . Kidney cancer Neg Hx     Social History:  reports that he quit smoking about 46 years ago. His smoking use included Cigars. He has quit using smokeless tobacco. He reports that he does not drink alcohol or use drugs.  ROS: UROLOGY Frequent Urination?: No Hard to postpone urination?: No Burning/pain with urination?: No Get up at night to urinate?: No Leakage of urine?: No Urine stream starts and stops?: No Trouble starting stream?: No Do you have to strain to urinate?: No Blood in urine?: No Urinary tract infection?: No Sexually transmitted disease?: No Injury to kidneys or bladder?: No Painful intercourse?: No Weak stream?: No Erection problems?: No Penile pain?: No  Gastrointestinal Nausea?: No Vomiting?: No Indigestion/heartburn?: No Diarrhea?: No Constipation?: No  Constitutional Fever: No Night sweats?: No Weight loss?: No Fatigue?: No  Skin Skin rash/lesions?: No Itching?: No  Eyes Blurred vision?: No Double vision?: No  Ears/Nose/Throat Sore throat?: No Sinus problems?: No  Hematologic/Lymphatic Swollen glands?: No Easy bruising?: No  Cardiovascular Leg swelling?: No Chest pain?: No  Respiratory Cough?: No Shortness of breath?: No  Endocrine Excessive thirst?: No  Musculoskeletal Back pain?: No Joint pain?: No  Neurological Headaches?: No Dizziness?: No  Psychologic Depression?: No Anxiety?: No  Physical  Exam: BP (!) 112/59   Pulse 96   Ht 6\' 2"  (1.88 m)   Wt 230 lb (104.3 kg)   BMI 29.53 kg/m   Constitutional:  Alert and oriented, No acute distress.  Presents with wife today. HEENT: [place] AT, moist mucus membranes.  Trachea midline, no masses. Cardiovascular: No clubbing, cyanosis, or edema. Respiratory: Normal respiratory effort, no increased work of breathing. GI: Abdomen is soft, nontender, nondistended, no abdominal masses.  Protuberant with tiny umbilical hernia.  No  abdominal scars. GU: No CVA tenderness.  Skin: No rashes, bruises or suspicious lesions. Neurologic: Grossly intact, no focal deficits, moving all 4 extremities. Psychiatric: Normal mood and affect.  He is appropriately tearful at times when discussing diagnosis.  Laboratory Data:  Lab Results  Component Value Date   CREATININE 1.40 (H) 11/12/2016   PSA as above  Urinalysis N/a  Pertinent Imaging: CLINICAL DATA:  Prostate cancer staging, Gleason 4 + 5 = 9.  EXAM: CT ABDOMEN AND PELVIS WITH CONTRAST  TECHNIQUE: Multidetector CT imaging of the abdomen and pelvis was performed using the standard protocol following bolus administration of intravenous contrast.  CONTRAST:  16mL ISOVUE-300 IOPAMIDOL (ISOVUE-300) INJECTION 61%  COMPARISON:  None.  FINDINGS: Lower chest: Unremarkable  Hepatobiliary: Diffuse hepatic steatosis.  Pancreas: Unremarkable  Spleen: Unremarkable  Adrenals/Urinary Tract: Adrenal glands normal. Several tiny hypodense lesions of the right kidney are technically too small to characterize although statistically likely to be cysts.  Stomach/Bowel: Unremarkable  Vascular/Lymphatic: Aortoiliac atherosclerotic vascular disease. No pathologic adenopathy identified.  Reproductive: The prostate gland measures 6.0 by 4.8 by 5.7 cm (volume = 86 cm^3). Seminal vesicles appear symmetric. Slight prominence of venous structures in the upper scrotum bilaterally, cannot  exclude varicoceles.  Other: No supplemental non-categorized findings.  Musculoskeletal: Severe arthropathy of the right hip and moderate arthropathy of the left hip, with prominent spurring and loss of articular space, and some early flattening of the right femoral head. Sclerosis with central lucency anteriorly in the right acetabulum, likely from degenerative or erosive arthropathy. Fragmented spurring of the right anterior acetabulum.  No compelling findings of osseous metastatic disease. Mild to moderate left foraminal stenosis at L5-S1 due to facet spurring.  IMPRESSION: 1. No adenopathy or specific evidence of metastatic prostate cancer in the abdomen or pelvis. Prostate volume estimated at 86 cubic cm. 2. Other imaging findings of potential clinical significance: Right greater than left hip arthropathy. Lumbar spondylosis and degenerative disc disease likely causing left foraminal impingement at L5-S1. Aortic Atherosclerosis (ICD10-I70.0). Diffuse hepatic steatosis. Possible scrotal varicoceles bilaterally.   Electronically Signed   By: Van Clines M.D.   On: 11/12/2016 14:55  CLINICAL DATA:  66 year old with new diagnosis of prostate cancer. Patient complains of right knee pain and low back pain. Initial staging examination.  EXAM: NUCLEAR MEDICINE WHOLE BODY BONE SCAN  TECHNIQUE: Whole body anterior and posterior images were obtained approximately 3 hours after intravenous injection of radiopharmaceutical.  RADIOPHARMACEUTICALS:  24.5 mCi Technetium-70m MDP IV.  COMPARISON:  No prior nuclear imaging. Bone window images from CT abdomen and pelvis 11/12/2016.  FINDINGS: No abnormal osseous activity to suggest metastatic disease.  Degenerative uptake is present involving multiple joints including:  Bilateral hips, right greater than left, as noted on the CT earlier today.  Bilateral knees.  Bilateral glenohumeral  joints.  Bilateral acromioclavicular joints.  Bilateral sternoclavicular joints.  Bilateral hands, wrists and elbows (visualized portions).  Thoracic and lumbar spine.  Activity is present in the calcified costal cartilages of the first ribs bilaterally and there is scattered activity involving other costal cartilages.  IMPRESSION: 1. No evidence of osseous metastatic disease. 2. Degenerative activity involving multiple joints as detailed above.   Electronically Signed   By: Evangeline Dakin M.D.   On: 11/12/2016 16:07  Assessment & Plan:    1. Prostate cancer [facility]) 66 year old male with T2a high risk prostate cancer, Gleason 4+5 on the left as well as 3+3 bilaterally.  Staging including CT scan and bone scan show no obvious  evidence of metastatic disease.  The patient was counseled about the natural history of prostate cancer and the standard treatment options that are available for prostate cancer. It was explained to him how his age and life expectancy, clinical stage, Gleason score, and PSA affect his prognosis, the decision to proceed with additional staging studies, as well as how that information influences recommended treatment strategies. We discussed the roles for active surveillance, radiation therapy, surgical therapy, androgen deprivation, as well as ablative therapy options for the treatment of prostate cancer as appropriate to his individual cancer situation. We discussed the risks and benefits of these options with regard to their impact on cancer control and also in terms of potential adverse events, complications, and impact on quality of life particularly related to urinary, bowel, and sexual function. The patient was encouraged to ask questions throughout the discussion today and all questions were answered to his stated satisfaction. In addition, the patient was providedwith and/or directed to appropriate resources and literature for further education about  prostate cancer treatment options.  We discussed surgical therapy for prostate cancer including the different available surgical approaches. We discussed, in detail, the risks and expectations of surgery with regard to cancer control, urinary control, and erectile dysfunction as well as expected post operative cover he processed. Additional risks of surgery including but not limalited to bleeding, infection, hernia formation, nerve damage, steel formation, bowel/rect injury, potentially necessitating colostomy, damage to the urinary tract resulting in urinary leakage, urethral stricture, and cardiopulmonary risk such as myocardial infarction, stroke, death, thromboembolism etc. were explained. The risk of open surgical conversion for robotics/laparoscopic prostatectomy is also discussed.  Specifically today,  MSK nomogram was reviewed. He has a very high chance of having extracapsular extension, involvement of the neurovascular bundle, seminal vesicles, and lymph nodes. Despite this, given his relatively young age and aggressive tumor, I would highly recommend consideration of radical cystoprostatectomy with bilateral lymph node dissection. He understands that this may or may not be curative and may need adjuvant therapy in the form of salvage/ adjvant radiation or possibly even ADT pending pathology.  After our discussion, he is most likely interested in having surgery first. I would like him to go see Dr. Berton Mount for a second opinion as well as to discuss adjuvant/salvage therapies down the road if needed.  I'll also refer him to the pelvic floor therapy prior to any surgical intervention.  - Ambulatory referral to Radiation Oncology - Ambulatory referral to Physical Therapy   Hollice Espy, MD  [place] [address] [place], [place] [address] [phone]  I spent 40 min with this patient of which greater than 50% was spent in counseling and  coordination of care with the patient.

## 2016-11-18 ENCOUNTER — Telehealth: Payer: Self-pay | Admitting: Radiology

## 2016-11-18 ENCOUNTER — Other Ambulatory Visit: Payer: Self-pay | Admitting: Radiology

## 2016-11-18 DIAGNOSIS — C61 Malignant neoplasm of prostate: Secondary | ICD-10-CM

## 2016-11-18 NOTE — Telephone Encounter (Signed)
Notified pt's wife, [name], of robotic prostatectomy scheduled with Dr Erlene Quan on 12/10/16, pre-admit testing appt on 11/29/16 @9 :00 & to call day prior to surgery for arrival time to SDS. Advised pt to be npo after mn day of surgery. Questions answered to wife's satisfaction & no further questions at this time. Wife voices understanding.

## 2016-11-21 ENCOUNTER — Other Ambulatory Visit: Payer: Self-pay | Admitting: Radiology

## 2016-11-26 ENCOUNTER — Encounter: Payer: Self-pay | Admitting: Physical Therapy

## 2016-11-26 ENCOUNTER — Ambulatory Visit: Payer: Medicare Other | Attending: Urology | Admitting: Physical Therapy

## 2016-11-26 ENCOUNTER — Other Ambulatory Visit: Payer: Managed Care, Other (non HMO)

## 2016-11-26 ENCOUNTER — Ambulatory Visit: Payer: Managed Care, Other (non HMO)

## 2016-11-26 DIAGNOSIS — M6281 Muscle weakness (generalized): Secondary | ICD-10-CM | POA: Diagnosis not present

## 2016-11-26 DIAGNOSIS — R278 Other lack of coordination: Secondary | ICD-10-CM | POA: Diagnosis present

## 2016-11-26 NOTE — Patient Instructions (Signed)
Ways to decrease load on the pelvic floor and abdominal muscles   _ exhale when you lift (no breathholding)   _Proper body mechanics with getting out of a chair to decrease strain  on back &pelvic floor   Avoid holding your breath when Getting out of the chair:  Scoot to front part of chair chair Heels behind feet, feet are hip width apart, nose over toes  Inhale like you are smelling roses Exhale to stand    _    Log rolling out of .bed   R arm overhead  Raise hips and scoot hips to L   Drop knees to R,  scooting R shoulder back to get completely on your R side so your shoulders, hips, and knees point to the R    Then breathe as you drop feet off bed and prop onto R elbow and  use both hands to push yourself     ___  You are now ready to begin training the deep core muscles system: diaphragm, transverse abdominis, pelvic floor . These muscles must work together as a team.           The key to these exercises to train the brain to coordinate the timing of these muscles and to have them turn on for long periods of time to hold you upright against gravity (especially important if you are on your feet all day).These muscles are postural muscles and play a role stabilizing your spine and bodyweight. By doing these repetitions slowly and correctly instead of doing crunches, you will achieve a flatter belly without a lower pooch. You are also placing your spine in a more neutral position and breathing properly which in turn, decreases your risk for problems related to your pelvic floor, abdominal, and low back such as pelvic organ prolapse, hernias, diastasis recti (separation of superficial muscles), disk herniations, spinal fractures. These exercises set a solid foundation for you to later progress to resistance/ strength training with therabands and weights and return to other typical fitness exercises with a stronger deeper core.   Do level 1 : 10 reps Do level 2: 10 reps  (left and right = 1 rep) x 3 sets , 2 x day

## 2016-11-27 NOTE — Therapy (Addendum)
[place] MAIN [facility] SERVICES [address] [place], [place], [address] Phone: [phone]   Fax:  [phone]  Physical Therapy Evaluation  Patient Details  Name: Lee Graham MRN: [id number] Date of Birth: 04-11-1951 Referring Provider: Erlene Quan   Encounter Date: 11/26/2016      PT End of Session - 11/26/16 1728    Visit Number 1   Number of Visits 12   Date for PT Re-Evaluation 02/25/17   Authorization Type g code 1/10    PT Start Time 6789   PT Stop Time 1730   PT Time Calculation (min) 52 min   Activity Tolerance Patient tolerated treatment well;No increased pain   Behavior During Therapy WFL for tasks assessed/performed      Past Medical History:  Diagnosis Date  . Arthritis   . Cancer ([place])   . Deafness in left ear    ONLY 30% HEARING IN RIGHT EAR  . Hyperlipidemia   . Hypertension     Past Surgical History:  Procedure Laterality Date  . COLONOSCOPY WITH PROPOFOL N/A 08/30/2015   Procedure: COLONOSCOPY WITH PROPOFOL;  Surgeon: Christene Lye, MD;  Location: [facility] ENDOSCOPY;  Service: Endoscopy;  Laterality: N/A;  . FINGER SURGERY Right     There were no vitals filed for this visit.       Subjective Assessment - 11/26/16 1704    Subjective Pt is scheduled for prostate surgery 12/09/16. Pt has been lifting weights 2x/ daily. Heaviest lifting of 145 lb with dumbbells, bars for BUE and double leg lifts 35-100lbs.  Pt is retired now and enjoyed working in his yard. 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   Pt has been instructed by his surgeon to not to lift heavy objects, cut his grass, nor leg lift exercises.    Patient is accompained by: Family member   Pertinent History Hx of falling from 2-3 stories and landed straddled over a  2x 6 board which caused swelling inthe perineal area. This injury occured 30-35 years.             [facility] PT Assessment - 11/26/16 1709      Assessment   Medical Diagnosis prostate cancer    Referring Provider Erlene Quan      Precautions   Precautions Other (comment)   Precaution Comments no heavy lifting, extrenuous activity      Restrictions   Weight Bearing Restrictions No     Balance Screen   Has the patient fallen in the past 6 months No     Observation/Other Assessments   Observations leg lifts he performs as his exercises involved abdominal bulging   breathholding      Coordination   Gross Motor Movements are Fluid and Coordinated --  no pelvic floor activation with cue     Sit to Stand   Comments breathholding      Other:   Other/ Comments lifting 10 lb with breatholding      Posture/Postural Control   Posture Comments lumbopelvic pertubatino with leg movements in hooklying      ROM / Strength   AROM / PROM / Strength --  WFL in all six directions of spine            Objective measurements completed on examination: See above findings.        Pelvic Floor Special Questions - 11/26/16 1708    Diastasis Recti 81fingers width above umbilicus, 3 fingers below sternum  and umbilicus                   PT Education - 2016-12-10 1725    Education provided Yes   Education Details HEP, POC,    Person(s) Educated Patient   Methods Explanation;Demonstration;Tactile cues;Verbal cues;Handout   Comprehension Returned demonstration;Verbalized understanding             PT Long Term Goals - 12/09/16 1311      PT LONG TERM GOAL #1   Title Pt will demo decreased abdominal separation from 4 fingers width above umbilicus < 2 fingers in order to improve intrabdominal pressure for less risk fo urinary incontinence   Time 12   Period Weeks   Status New     PT LONG TERM GOAL #2   Title Pt will demo proper co-activate his deep core mm with fitness exercises and proper body mechanics with ADLs in order to minimize risk for injuries and optimize pelvic floor    Time 12   Period Weeks   Status New      PT LONG TERM GOAL #3   Title Pt will demo IND with pelvic floor HEP to increased strength for pelvic health post surgery   Time 12   Period Weeks   Status New                   Plan - 11/27/16 2354    Clinical Impression Statement Pt is a 66 yo male who is scheduled for prostate surgery 12/08/16. Pt was referred to [facility] PT for pelvic floor training to minimize urinary incontinence post surgery. Pt's clinical presentations include abdominal separation of rectus mm, poor body mechanics and technique for fitness routines which place downward strain onto his spine and pelvic floor mm. These deficits indicate a less efficient intraabdominal pressure system which increases his risk for urinary incontinence post-surgery. Pt would benefit from skilled PT s/p surgery.        Clinical Presentation Evolving   Clinical Decision Making Low   Rehab Potential Good   PT Frequency One time visit   PT Treatment/Interventions Neuromuscular re-education;Therapeutic activities;Therapeutic exercise;Patient/family education;ADLs/Self Care Home Management   Consulted and Agree with Plan of Care Patient      Patient will benefit from skilled therapeutic intervention in order to improve the following deficits and impairments:  Postural dysfunction, Improper body mechanics, Decreased coordination, Decreased mobility, Decreased endurance, Decreased activity tolerance, Hypomobility, Obesity  Visit Diagnosis: Muscle weakness (generalized)  Other lack of coordination      G-Codes - 12-10-16 1730    Functional Assessment Tool Used (Outpatient Only) clinical judgement    Functional Limitation Other PT primary   Other PT Primary Current Status (M0102) At least 40 percent but less than 60 percent impaired, limited or restricted   Other PT Primary Goal Status (V2536) At least 20 percent but less than 40 percent impaired, limited or restricted       Problem List Patient Active Problem List    Diagnosis Date Noted  . HLD (hyperlipidemia) 10/16/2016  . Hypertension 10/16/2016  . CTS (carpal tunnel syndrome) 07/31/2010  . Crushing injury of right index finger 06/06/2010  . 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

## 2016-11-29 ENCOUNTER — Encounter
Admission: RE | Admit: 2016-11-29 | Discharge: 2016-11-29 | Disposition: A | Discharge status: Home / Self Care | Payer: Medicare Other | Source: Ambulatory Visit | Attending: Urology | Admitting: Urology

## 2016-11-29 DIAGNOSIS — R001 Bradycardia, unspecified: Secondary | ICD-10-CM | POA: Insufficient documentation

## 2016-11-29 DIAGNOSIS — I10 Essential (primary) hypertension: Secondary | ICD-10-CM | POA: Diagnosis not present

## 2016-11-29 DIAGNOSIS — Z79899 Other long term (current) drug therapy: Secondary | ICD-10-CM | POA: Insufficient documentation

## 2016-11-29 DIAGNOSIS — Z7982 Long term (current) use of aspirin: Secondary | ICD-10-CM | POA: Insufficient documentation

## 2016-11-29 DIAGNOSIS — Z01812 Encounter for preprocedural laboratory examination: Secondary | ICD-10-CM | POA: Insufficient documentation

## 2016-11-29 DIAGNOSIS — Z01810 Encounter for preprocedural cardiovascular examination: Secondary | ICD-10-CM | POA: Insufficient documentation

## 2016-11-29 DIAGNOSIS — C61 Malignant neoplasm of prostate: Secondary | ICD-10-CM | POA: Insufficient documentation

## 2016-11-29 DIAGNOSIS — Z808 Family history of malignant neoplasm of other organs or systems: Secondary | ICD-10-CM | POA: Insufficient documentation

## 2016-11-29 LAB — URINALYSIS, ROUTINE W REFLEX MICROSCOPIC
BACTERIA UA: NONE SEEN
Bilirubin Urine: NEGATIVE
Glucose, UA: NEGATIVE mg/dL
KETONES UR: NEGATIVE mg/dL
LEUKOCYTES UA: NEGATIVE
Nitrite: NEGATIVE
PH: 7 (ref 5.0–8.0)
Protein, ur: NEGATIVE mg/dL
SQUAMOUS EPITHELIAL / LPF: NONE SEEN
Specific Gravity, Urine: 1.008 (ref 1.005–1.030)

## 2016-11-29 LAB — BASIC METABOLIC PANEL
ANION GAP: 9 (ref 5–15)
BUN: 20 mg/dL (ref 6–20)
CALCIUM: 9.3 mg/dL (ref 8.9–10.3)
CO2: 26 mmol/L (ref 22–32)
Chloride: 100 mmol/L — ABNORMAL LOW (ref 101–111)
Creatinine, Ser: 1.35 mg/dL — ABNORMAL HIGH (ref 0.61–1.24)
GFR, EST NON AFRICAN AMERICAN: 53 mL/min — AB (ref >60)
Glucose, Bld: 87 mg/dL (ref 65–99)
Potassium: 3.6 mmol/L (ref 3.5–5.1)
SODIUM: 135 mmol/L (ref 135–145)

## 2016-11-29 LAB — CBC
HCT: 40.2 % (ref 40.0–52.0)
HEMOGLOBIN: 14 g/dL (ref 13.0–18.0)
MCH: 29.4 pg (ref 26.0–34.0)
MCHC: 34.8 g/dL (ref 32.0–36.0)
MCV: 84.7 fL (ref 80.0–100.0)
Platelets: 266 10*3/uL (ref 150–440)
RBC: 4.75 MIL/uL (ref 4.40–5.90)
RDW: 13.5 % (ref 11.5–14.5)
WBC: 6.6 10*3/uL (ref 3.8–10.6)

## 2016-11-29 LAB — APTT: APTT: 26 s (ref 24–36)

## 2016-11-29 LAB — PROTIME-INR
INR: 1.25
PROTHROMBIN TIME: 15.8 s — AB (ref 11.4–15.2)

## 2016-11-29 LAB — TYPE AND SCREEN
ABO/RH(D): B POS
ANTIBODY SCREEN: NEGATIVE

## 2016-11-29 NOTE — Patient Instructions (Signed)
  Your procedure is scheduled [id number] July 9 , 2018. Report to Same Day Surgery. To find out your arrival time please call [phone] between 1PM - 3PM on Friday December 06, 2016.  Remember: Instructions that are not followed completely may result in serious medical risk, up to and including death, or upon the discretion of your surgeon and anesthesiologist your surgery may need to be rescheduled.    _x___ 1. Do not eat food or drink liquids after midnight. No gum chewing or hard candies.     ____ 2. No Alcohol for 24 hours before or after surgery.   ____ 3. Bring all medications with you on the day of surgery if instructed.    __x__ 4. Notify your doctor if there is any change in your medical condition     (cold, fever, infections).    _____ 5. No smoking 24 hours prior to surgery.     Do not wear jewelry, make-up, hairpins, clips or nail polish.  Do not wear lotions, powders, or perfumes.   Do not shave 48 hours prior to surgery. Men may shave face and neck.  Do not bring valuables to the hospital.    [facility] is not responsible for any belongings or valuables.               Contacts, dentures or bridgework may not be worn into surgery.  Leave your suitcase in the car. After surgery it may be brought to your room.  For patients admitted to the hospital, discharge time is determined by your treatment team.   Patients discharged the day of surgery will not be allowed to drive home.    Please read over the following fact sheets that you were given:   [facility] Preparing for Surgery  __x__ Take these medicines the morning of surgery with A SIP OF WATER:    1. metoprolol succinate (TOPROL-XL)  2. telmisartan (MICARDIS)    ____ Fleet Enema (as directed)   __x__ Use CHG Soap as directed on instruction sheet  ____ Use inhalers on the day of surgery and bring to hospital day of surgery  ____ Stop metformin 2 days prior to surgery    ____ Take 1/2 of usual insulin dose  the night before surgery and none on the morning of surgery.   ____ Stop Coumadin/Plavix/aspirin on does not apply.  _x___ Stop Anti-inflammatories such as Advil, Aleve, Ibuprofen, Motrin, Naproxen, Naprosyn, Goodies powders or aspirin  products. OK to take Tylenol.   ____ Stop supplements until after surgery.    ____ Bring C-Pap to the hospital.

## 2016-11-30 LAB — URINE CULTURE: Culture: NO GROWTH

## 2016-12-02 ENCOUNTER — Ambulatory Visit
Admission: RE | Admit: 2016-12-02 | Discharge: 2016-12-02 | Disposition: A | Discharge status: Home / Self Care | Payer: Medicare Other | Source: Ambulatory Visit | Attending: Radiation Oncology | Admitting: Radiation Oncology

## 2016-12-02 ENCOUNTER — Encounter: Payer: Self-pay | Admitting: Radiation Oncology

## 2016-12-02 VITALS — BP 134/81 | HR 82 | Temp 95.8°F | Resp 20 | Wt 227.3 lb

## 2016-12-02 DIAGNOSIS — C61 Malignant neoplasm of prostate: Secondary | ICD-10-CM | POA: Diagnosis present

## 2016-12-02 DIAGNOSIS — N529 Male erectile dysfunction, unspecified: Secondary | ICD-10-CM | POA: Diagnosis not present

## 2016-12-02 DIAGNOSIS — I10 Essential (primary) hypertension: Secondary | ICD-10-CM | POA: Insufficient documentation

## 2016-12-02 DIAGNOSIS — Z87891 Personal history of nicotine dependence: Secondary | ICD-10-CM | POA: Insufficient documentation

## 2016-12-02 DIAGNOSIS — M129 Arthropathy, unspecified: Secondary | ICD-10-CM | POA: Diagnosis not present

## 2016-12-02 DIAGNOSIS — Z7982 Long term (current) use of aspirin: Secondary | ICD-10-CM | POA: Insufficient documentation

## 2016-12-02 DIAGNOSIS — Z79899 Other long term (current) drug therapy: Secondary | ICD-10-CM | POA: Diagnosis not present

## 2016-12-02 DIAGNOSIS — E785 Hyperlipidemia, unspecified: Secondary | ICD-10-CM | POA: Diagnosis not present

## 2016-12-02 NOTE — Consult Note (Signed)
NEW PATIENT EVALUATION  Name: Lee Graham  MRN: [id number]  Date:   12/02/2016     DOB: [birth year]   This 66 y.o. male patient presents to the clinic for initial evaluation of adenocarcinoma the prostate Gleason 9 (4+5) presenting with a PSA of 10.7.  REFERRING PHYSICIAN: Albina Billet, MD  CHIEF COMPLAINT:  Chief Complaint  Patient presents with  . Prostate Cancer    Pt is here for initial consult of prostate cancer.      DIAGNOSIS: The encounter diagnosis was Malignant neoplasm of prostate ([place]).   PREVIOUS INVESTIGATIONS:  Bone scan and CT scans reviewed Pathology report reviewed Clinical notes reviewed  HPI: Patient is a 66 year old male who was being followed by his PMD with yearly PSAs. His recent one had shot up to 10.7. He was seen by urology noted to have a 67 g prostate underwent transrectal ultrasound-guided biopsies showing 8 out of 12 biopsies positive for adenocarcinoma with 3 of them showing Gleason 9 (4+5). He had a CT scan of abdomen and pelvis as well as bone scan both were negative for any evidence of metastatic disease or pelvic adenopathy. Patient has nocturia 2 no other significant frequency or urgency. He does have baseline erectile dysfunction. He's been seen by urology and is now seen for radiation oncology opinion. He is having no bone pain.  PLANNED TREATMENT REGIMEN: Prostatectomy followed by possible salvage radiation  PAST MEDICAL HISTORY:  has a past medical history of Arthritis; Cancer ([place]) (11/13/2016); Deafness in left ear; Hyperlipidemia; and Hypertension.    PAST SURGICAL HISTORY:  Past Surgical History:  Procedure Laterality Date  . COLONOSCOPY WITH PROPOFOL N/A 08/30/2015   Procedure: COLONOSCOPY WITH PROPOFOL;  Surgeon: Christene Lye, MD;  Location: [facility] ENDOSCOPY;  Service: Endoscopy;  Laterality: N/A;  . FINGER SURGERY Right     FAMILY HISTORY: family history includes Colon cancer in his father.  SOCIAL HISTORY:  reports  that he quit smoking about 46 years ago. His smoking use included Cigars. He has quit using smokeless tobacco. He reports that he does not drink alcohol or use drugs.  ALLERGIES: Patient has no known allergies.  MEDICATIONS:  Current Outpatient Prescriptions  Medication Sig Dispense Refill  . metoprolol succinate (TOPROL-XL) 100 MG 24 hr tablet Take 50 mg by mouth daily. Takes 0.5 tablet in am.    . simvastatin (ZOCOR) 40 MG tablet Take 40 mg by mouth daily at 6 PM.     . telmisartan (MICARDIS) 40 MG tablet Take 20 mg by mouth daily. Pt takes 0.5 tablet in am.    . triamterene-hydrochlorothiazide (DYAZIDE) 37.5-25 MG capsule Take 1 capsule by mouth daily.     Marland Kitchen aspirin EC 81 MG tablet Take 81 mg by mouth daily.     No current facility-administered medications for this encounter.     ECOG PERFORMANCE STATUS:  0 - Asymptomatic  REVIEW OF SYSTEMS:  Patient denies any weight loss, fatigue, weakness, fever, chills or night sweats. Patient denies any loss of vision, blurred vision. Patient denies any ringing  of the ears or hearing loss. No irregular heartbeat. Patient denies heart murmur or history of fainting. Patient denies any chest pain or pain radiating to her upper extremities. Patient denies any shortness of breath, difficulty breathing at night, cough or hemoptysis. Patient denies any swelling in the lower legs. Patient denies any nausea vomiting, vomiting of blood, or coffee ground material in the vomitus. Patient denies any stomach pain. Patient states has had normal  bowel movements no significant constipation or diarrhea. Patient denies any dysuria, hematuria or significant nocturia. Patient denies any problems walking, swelling in the joints or loss of balance. Patient denies any skin changes, loss of hair or loss of weight. Patient denies any excessive worrying or anxiety or significant depression. Patient denies any problems with insomnia. Patient denies excessive thirst, polyuria,  polydipsia. Patient denies any swollen glands, patient denies easy bruising or easy bleeding. Patient denies any recent infections, allergies or URI. Patient "s visual fields have not changed significantly in recent time.    PHYSICAL EXAM: BP 134/81   Pulse 82   Temp (!) 95.8 F (35.4 C)   Resp 20   Wt 227 lb 4.7 oz (103.1 kg)   BMI 29.18 kg/m  On rectal exam rectal sphincter tone is good prostate is enlarged some slight asymmetry with right lobe greater than left although no discrete nodularity was appreciated. Sulcus was preserved bilaterally. No other rectal abnormality was identified. Well-developed well-nourished patient in NAD. HEENT reveals PERLA, EOMI, discs not visualized.  Oral cavity is clear. No oral mucosal lesions are identified. Neck is clear without evidence of cervical or supraclavicular adenopathy. Lungs are clear to A&P. Cardiac examination is essentially unremarkable with regular rate and rhythm without murmur rub or thrill. Abdomen is benign with no organomegaly or masses noted. Motor sensory and DTR levels are equal and symmetric in the upper and lower extremities. Cranial nerves II through XII are grossly intact. Proprioception is intact. No peripheral adenopathy or edema is identified. No motor or sensory levels are noted. Crude visual fields are within normal range.  LABORATORY DATA: Pathology reports reviewed    RADIOLOGY RESULTS: CT scans and bone scan reviewed   IMPRESSION: A Gleason 9 adenocarcinoma the prostate in 66 year old male presenting with a PSA of 10.  PLAN: At this time patient is a large bulky prostate. This would not be amenable to I-125 interstitial implant based on the large size of the gland. I would favor surgery followed by radiation therapy should his PSA started to rise or there is definitive proof of positive margins or pelvic lymph node involvement. Risks and benefits of salvage radiation therapy were reviewed with the wife and her husband.  Side effects such as possible intermittent diarrhea increased lower urinary tract symptoms fatigue alteration of blood counts all were discussed in detail with the patient and his wife. Patient is scheduled for prostatectomy next week. Will review her case should he have clinical evidence for treating with salvage radiation therapy.  I would like to take this opportunity to thank you for allowing me to participate in the care of your patient.Armstead Peaks., MD

## 2016-12-08 MED ORDER — CEFAZOLIN SODIUM-DEXTROSE 2-4 GM/100ML-% IV SOLN
2.0000 g | INTRAVENOUS | Status: AC
  Administered 2016-12-09 (×2): 2 g via INTRAVENOUS

## 2016-12-09 ENCOUNTER — Observation Stay
Admission: RE | Admit: 2016-12-09 | Discharge: 2016-12-10 | Disposition: A | Discharge status: Home / Self Care | Payer: Medicare Other | Source: Ambulatory Visit | Attending: Urology | Admitting: Urology

## 2016-12-09 ENCOUNTER — Encounter
Admission: RE | Disposition: A | Discharge status: Home / Self Care | Payer: Self-pay | Source: Ambulatory Visit | Attending: Urology

## 2016-12-09 ENCOUNTER — Encounter: Payer: Self-pay | Admitting: *Deleted

## 2016-12-09 ENCOUNTER — Ambulatory Visit: Payer: Medicare Other | Admitting: Anesthesiology

## 2016-12-09 DIAGNOSIS — Z87891 Personal history of nicotine dependence: Secondary | ICD-10-CM | POA: Diagnosis not present

## 2016-12-09 DIAGNOSIS — Z79899 Other long term (current) drug therapy: Secondary | ICD-10-CM | POA: Insufficient documentation

## 2016-12-09 DIAGNOSIS — I700 Atherosclerosis of aorta: Secondary | ICD-10-CM | POA: Diagnosis not present

## 2016-12-09 DIAGNOSIS — M1990 Unspecified osteoarthritis, unspecified site: Secondary | ICD-10-CM | POA: Insufficient documentation

## 2016-12-09 DIAGNOSIS — I10 Essential (primary) hypertension: Secondary | ICD-10-CM | POA: Diagnosis not present

## 2016-12-09 DIAGNOSIS — E785 Hyperlipidemia, unspecified: Secondary | ICD-10-CM | POA: Insufficient documentation

## 2016-12-09 DIAGNOSIS — K760 Fatty (change of) liver, not elsewhere classified: Secondary | ICD-10-CM | POA: Insufficient documentation

## 2016-12-09 DIAGNOSIS — N529 Male erectile dysfunction, unspecified: Secondary | ICD-10-CM | POA: Insufficient documentation

## 2016-12-09 DIAGNOSIS — Z7982 Long term (current) use of aspirin: Secondary | ICD-10-CM | POA: Insufficient documentation

## 2016-12-09 DIAGNOSIS — C61 Malignant neoplasm of prostate: Secondary | ICD-10-CM | POA: Diagnosis present

## 2016-12-09 HISTORY — PX: ROBOT ASSISTED LAPAROSCOPIC RADICAL PROSTATECTOMY: SHX5141

## 2016-12-09 LAB — ABO/RH: ABO/RH(D): B POS

## 2016-12-09 SURGERY — ROBOTIC ASSISTED LAPAROSCOPIC RADICAL PROSTATECTOMY
Anesthesia: General | Site: Abdomen | Wound class: Clean Contaminated

## 2016-12-09 MED ORDER — TRIAMTERENE-HCTZ 37.5-25 MG PO CAPS
1.0000 | ORAL_CAPSULE | Freq: Every day | ORAL | Status: DC
  Administered 2016-12-09: 1 via ORAL
  Filled 2016-12-09 (×2): qty 1

## 2016-12-09 MED ORDER — DIPHENHYDRAMINE HCL 12.5 MG/5ML PO ELIX
12.5000 mg | ORAL_SOLUTION | Freq: Four times a day (QID) | ORAL | Status: DC | PRN
  Filled 2016-12-09: qty 5

## 2016-12-09 MED ORDER — LACTATED RINGERS IV SOLN
INTRAVENOUS | Status: DC
  Administered 2016-12-09 (×3): via INTRAVENOUS

## 2016-12-09 MED ORDER — DEXAMETHASONE SODIUM PHOSPHATE 10 MG/ML IJ SOLN
INTRAMUSCULAR | Status: DC | PRN
  Administered 2016-12-09: 10 mg via INTRAVENOUS

## 2016-12-09 MED ORDER — FAMOTIDINE 20 MG PO TABS
20.0000 mg | ORAL_TABLET | Freq: Once | ORAL | Status: AC
  Administered 2016-12-09: 20 mg via ORAL

## 2016-12-09 MED ORDER — MIDAZOLAM HCL 2 MG/2ML IJ SOLN
INTRAMUSCULAR | Status: AC
  Filled 2016-12-09: qty 2

## 2016-12-09 MED ORDER — FENTANYL CITRATE (PF) 100 MCG/2ML IJ SOLN
INTRAMUSCULAR | Status: AC
  Filled 2016-12-09: qty 2

## 2016-12-09 MED ORDER — PHENYLEPHRINE HCL 10 MG/ML IJ SOLN
INTRAMUSCULAR | Status: DC | PRN
  Administered 2016-12-09: 50 ug via INTRAVENOUS
  Administered 2016-12-09: 80 ug via INTRAVENOUS

## 2016-12-09 MED ORDER — ACETAMINOPHEN 325 MG PO TABS
650.0000 mg | ORAL_TABLET | ORAL | Status: DC | PRN
  Administered 2016-12-10: 650 mg via ORAL
  Filled 2016-12-09 (×2): qty 2

## 2016-12-09 MED ORDER — ROCURONIUM BROMIDE 100 MG/10ML IV SOLN
INTRAVENOUS | Status: DC | PRN
Start: 2016-12-09 — End: 2016-12-09
  Administered 2016-12-09: 10 mg via INTRAVENOUS
  Administered 2016-12-09: 15 mg via INTRAVENOUS
  Administered 2016-12-09: 40 mg via INTRAVENOUS
  Administered 2016-12-09 (×2): 10 mg via INTRAVENOUS
  Administered 2016-12-09: 15 mg via INTRAVENOUS
  Administered 2016-12-09: 10 mg via INTRAVENOUS

## 2016-12-09 MED ORDER — BELLADONNA ALKALOIDS-OPIUM 16.2-60 MG RE SUPP: 1.0000 | Freq: Four times a day (QID) | RECTAL | Status: DC | PRN

## 2016-12-09 MED ORDER — SUGAMMADEX SODIUM 200 MG/2ML IV SOLN
INTRAVENOUS | Status: DC | PRN
  Administered 2016-12-09: 200 mg via INTRAVENOUS

## 2016-12-09 MED ORDER — PROPOFOL 10 MG/ML IV BOLUS
INTRAVENOUS | Status: AC
  Filled 2016-12-09: qty 20

## 2016-12-09 MED ORDER — ROCURONIUM BROMIDE 50 MG/5ML IV SOLN
INTRAVENOUS | Status: AC
  Filled 2016-12-09: qty 1

## 2016-12-09 MED ORDER — SODIUM CHLORIDE 0.9 % IV SOLN
INTRAVENOUS | Status: DC
  Administered 2016-12-09 – 2016-12-10 (×3): via INTRAVENOUS

## 2016-12-09 MED ORDER — LIDOCAINE HCL (PF) 2 % IJ SOLN
INTRAMUSCULAR | Status: AC
  Filled 2016-12-09: qty 2

## 2016-12-09 MED ORDER — HEPARIN SODIUM (PORCINE) 5000 UNIT/ML IJ SOLN
5000.0000 [IU] | Freq: Three times a day (TID) | INTRAMUSCULAR | Status: DC
  Administered 2016-12-09 – 2016-12-10 (×3): 5000 [IU] via SUBCUTANEOUS
  Filled 2016-12-09 (×3): qty 1

## 2016-12-09 MED ORDER — EPHEDRINE SULFATE 50 MG/ML IJ SOLN
INTRAMUSCULAR | Status: AC
  Filled 2016-12-09: qty 1

## 2016-12-09 MED ORDER — METOPROLOL SUCCINATE ER 50 MG PO TB24
50.0000 mg | ORAL_TABLET | Freq: Every day | ORAL | Status: DC
  Administered 2016-12-10: 50 mg via ORAL
  Filled 2016-12-09: qty 1

## 2016-12-09 MED ORDER — SIMVASTATIN 20 MG PO TABS
40.0000 mg | ORAL_TABLET | Freq: Every day | ORAL | Status: DC
  Administered 2016-12-09: 40 mg via ORAL
  Filled 2016-12-09 (×2): qty 2

## 2016-12-09 MED ORDER — FENTANYL CITRATE (PF) 100 MCG/2ML IJ SOLN: 25.0000 ug | INTRAMUSCULAR | Status: DC | PRN

## 2016-12-09 MED ORDER — FENTANYL CITRATE (PF) 250 MCG/5ML IJ SOLN
INTRAMUSCULAR | Status: AC
  Filled 2016-12-09: qty 5

## 2016-12-09 MED ORDER — ONDANSETRON HCL 4 MG/2ML IJ SOLN: 4.0000 mg | INTRAMUSCULAR | Status: DC | PRN

## 2016-12-09 MED ORDER — MIDAZOLAM HCL 2 MG/2ML IJ SOLN
INTRAMUSCULAR | Status: DC | PRN
  Administered 2016-12-09 (×2): 1 mg via INTRAVENOUS

## 2016-12-09 MED ORDER — FENTANYL CITRATE (PF) 100 MCG/2ML IJ SOLN
INTRAMUSCULAR | Status: DC | PRN
  Administered 2016-12-09: 50 ug via INTRAVENOUS
  Administered 2016-12-09: 100 ug via INTRAVENOUS
  Administered 2016-12-09: 25 ug via INTRAVENOUS
  Administered 2016-12-09: 50 ug via INTRAVENOUS
  Administered 2016-12-09: 25 ug via INTRAVENOUS
  Administered 2016-12-09: 50 ug via INTRAVENOUS

## 2016-12-09 MED ORDER — BUPIVACAINE HCL (PF) 0.5 % IJ SOLN
INTRAMUSCULAR | Status: AC
  Filled 2016-12-09: qty 30

## 2016-12-09 MED ORDER — IRBESARTAN 150 MG PO TABS
150.0000 mg | ORAL_TABLET | Freq: Every day | ORAL | Status: DC
  Administered 2016-12-10: 150 mg via ORAL
  Filled 2016-12-09: qty 1

## 2016-12-09 MED ORDER — OXYBUTYNIN CHLORIDE 5 MG PO TABS: 5.0000 mg | ORAL_TABLET | Freq: Three times a day (TID) | ORAL | Status: DC | PRN

## 2016-12-09 MED ORDER — DIPHENHYDRAMINE HCL 50 MG/ML IJ SOLN: 12.5000 mg | Freq: Four times a day (QID) | INTRAMUSCULAR | Status: DC | PRN

## 2016-12-09 MED ORDER — ACETAMINOPHEN 10 MG/ML IV SOLN
INTRAVENOUS | Status: DC | PRN
  Administered 2016-12-09: 1000 mg via INTRAVENOUS

## 2016-12-09 MED ORDER — FAMOTIDINE 20 MG PO TABS
ORAL_TABLET | ORAL | Status: AC
  Administered 2016-12-09: 20 mg via ORAL
  Filled 2016-12-09: qty 1

## 2016-12-09 MED ORDER — MORPHINE SULFATE (PF) 2 MG/ML IV SOLN: 2.0000 mg | INTRAVENOUS | Status: DC | PRN

## 2016-12-09 MED ORDER — SUGAMMADEX SODIUM 500 MG/5ML IV SOLN
INTRAVENOUS | Status: AC
  Filled 2016-12-09: qty 5

## 2016-12-09 MED ORDER — BUPIVACAINE HCL 0.5 % IJ SOLN
INTRAMUSCULAR | Status: DC | PRN
  Administered 2016-12-09: 10 mL

## 2016-12-09 MED ORDER — DOCUSATE SODIUM 100 MG PO CAPS
100.0000 mg | ORAL_CAPSULE | Freq: Two times a day (BID) | ORAL | Status: DC
  Administered 2016-12-09 – 2016-12-10 (×2): 100 mg via ORAL
  Filled 2016-12-09 (×2): qty 1

## 2016-12-09 MED ORDER — ONDANSETRON HCL 4 MG/2ML IJ SOLN: 4.0000 mg | Freq: Once | INTRAMUSCULAR | Status: DC | PRN

## 2016-12-09 MED ORDER — LIDOCAINE HCL (CARDIAC) 20 MG/ML IV SOLN
INTRAVENOUS | Status: DC | PRN
  Administered 2016-12-09: 40 mg via INTRAVENOUS

## 2016-12-09 MED ORDER — SEVOFLURANE IN SOLN
RESPIRATORY_TRACT | Status: AC
  Filled 2016-12-09: qty 250

## 2016-12-09 MED ORDER — CEFAZOLIN SODIUM-DEXTROSE 2-4 GM/100ML-% IV SOLN
INTRAVENOUS | Status: AC
  Filled 2016-12-09: qty 100

## 2016-12-09 MED ORDER — ONDANSETRON HCL 4 MG/2ML IJ SOLN
INTRAMUSCULAR | Status: DC | PRN
  Administered 2016-12-09: 4 mg via INTRAVENOUS

## 2016-12-09 MED ORDER — PROPOFOL 10 MG/ML IV BOLUS
INTRAVENOUS | Status: DC | PRN
  Administered 2016-12-09: 150 mg via INTRAVENOUS

## 2016-12-09 MED ORDER — CEFAZOLIN SODIUM-DEXTROSE 1-4 GM/50ML-% IV SOLN
1.0000 g | Freq: Three times a day (TID) | INTRAVENOUS | Status: AC
  Administered 2016-12-09 – 2016-12-10 (×2): 1 g via INTRAVENOUS
  Filled 2016-12-09 (×2): qty 50

## 2016-12-09 MED ORDER — THROMBIN 5000 UNITS EX SOLR
CUTANEOUS | Status: DC | PRN
  Administered 2016-12-09: 5000 [IU] via TOPICAL

## 2016-12-09 MED ORDER — THROMBIN 5000 UNITS EX SOLR
CUTANEOUS | Status: AC
  Filled 2016-12-09: qty 5000

## 2016-12-09 MED ORDER — ACETAMINOPHEN 10 MG/ML IV SOLN
INTRAVENOUS | Status: AC
  Filled 2016-12-09: qty 100

## 2016-12-09 MED ORDER — OXYCODONE-ACETAMINOPHEN 5-325 MG PO TABS
1.0000 | ORAL_TABLET | ORAL | Status: DC | PRN
Start: 2016-12-09 — End: 2016-12-10
  Administered 2016-12-09 (×2): 2 via ORAL
  Filled 2016-12-09 (×2): qty 2

## 2016-12-09 SURGICAL SUPPLY — 100 items
ANCHOR TIS RET SYS 235ML (MISCELLANEOUS) ×6 IMPLANT
APPLICATOR SURGIFLO ENDO (HEMOSTASIS) ×3 IMPLANT
APPLIER CLIP LOGIC TI 5 (MISCELLANEOUS) ×3 IMPLANT
BAG URO DRAIN 2000ML W/SPOUT (MISCELLANEOUS) ×3 IMPLANT
BLADE CLIPPER SURG (BLADE) ×3 IMPLANT
BULB RESERV EVAC DRAIN JP 100C (MISCELLANEOUS) IMPLANT
CANISTER SUCT 1200ML W/VALVE (MISCELLANEOUS) ×3 IMPLANT
CATH DRAINAGE MALECOT 26FR (CATHETERS) ×1 IMPLANT
CATH FOL 2WAY LX 18X5 (CATHETERS) ×3 IMPLANT
CATH MALECOT (CATHETERS) ×3
CHLORAPREP W/TINT 26ML (MISCELLANEOUS) ×6 IMPLANT
CLIP LIGATING HEM O LOK PURPLE (MISCELLANEOUS) ×3 IMPLANT
CLIP SUT LAPRA TY ABSORB (SUTURE) IMPLANT
CORD BIP STRL DISP 12FT (MISCELLANEOUS) ×3 IMPLANT
CORD MONOPOLAR M/FML 12FT (MISCELLANEOUS) ×3 IMPLANT
COVER TIP SHEARS 8 DVNC (MISCELLANEOUS) ×1 IMPLANT
COVER TIP SHEARS 8MM DA VINCI (MISCELLANEOUS) ×2
DEFOGGER SCOPE WARMER CLEARIFY (MISCELLANEOUS) ×3 IMPLANT
DERMABOND ADVANCED (GAUZE/BANDAGES/DRESSINGS) ×2
DERMABOND ADVANCED .7 DNX12 (GAUZE/BANDAGES/DRESSINGS) ×1 IMPLANT
DRAIN CHANNEL JP 15F RND 16 (MISCELLANEOUS) IMPLANT
DRAIN CHANNEL JP 19F (MISCELLANEOUS) IMPLANT
DRAPE LEGGINS SURG 28X43 STRL (DRAPES) ×3 IMPLANT
DRAPE SHEET LG 3/4 BI-LAMINATE (DRAPES) ×3 IMPLANT
DRAPE SURG 17X11 SM STRL (DRAPES) ×12 IMPLANT
DRAPE TABLE BACK 80X90 (DRAPES) ×3 IMPLANT
DRAPE UNDER BUTTOCK W/FLU (DRAPES) ×3 IMPLANT
DRIVER LRG NEEDLE DA VINCI (INSTRUMENTS) ×4
DRIVER NDLE LRG DVNC (INSTRUMENTS) ×2 IMPLANT
DRSG TELFA 3X8 NADH (GAUZE/BANDAGES/DRESSINGS) ×3 IMPLANT
ELECT REM PT RETURN 9FT ADLT (ELECTROSURGICAL) ×3
ELECTRODE REM PT RTRN 9FT ADLT (ELECTROSURGICAL) ×1 IMPLANT
FILTER LAP SMOKE EVAC STRL (MISCELLANEOUS) ×3 IMPLANT
FORCEPS MARYLAND BIPOLAR 8X55 (INSTRUMENTS) ×2
FORCEPS MRYLND BPLR 8X55 DVNC (INSTRUMENTS) ×1 IMPLANT
GLOVE BIO SURGEON STRL SZ 6.5 (GLOVE) ×6 IMPLANT
GLOVE BIO SURGEONS STRL SZ 6.5 (GLOVE) ×3
GOWN STRL REUS W/ TWL LRG LVL3 (GOWN DISPOSABLE) ×3 IMPLANT
GOWN STRL REUS W/TWL LRG LVL3 (GOWN DISPOSABLE) ×6
GRASPER SUT TROCAR 14GX15 (MISCELLANEOUS) ×3 IMPLANT
HEMOSTAT SURGICEL 2X14 (HEMOSTASIS) IMPLANT
HOLDER FOLEY CATH W/STRAP (MISCELLANEOUS) ×3 IMPLANT
IRRIGATION STRYKERFLOW (MISCELLANEOUS) ×1 IMPLANT
IRRIGATOR STRYKERFLOW (MISCELLANEOUS) ×3
IV LACTATED RINGERS 1000ML (IV SOLUTION) ×3 IMPLANT
JELLY LUB 2OZ STRL (MISCELLANEOUS) ×2
JELLY LUBE 2OZ STRL (MISCELLANEOUS) ×1 IMPLANT
KIT ACCESSORY DA VINCI DISP (KITS) ×2
KIT ACCESSORY DVNC DISP (KITS) ×1 IMPLANT
KIT PINK PAD W/HEAD ARE REST (MISCELLANEOUS) ×3
KIT PINK PAD W/HEAD ARM REST (MISCELLANEOUS) ×1 IMPLANT
LABEL OR SOLS (LABEL) ×3 IMPLANT
MARKER SKIN DUAL TIP RULER LAB (MISCELLANEOUS) ×3 IMPLANT
NDL SAFETY 18GX1.5 (NEEDLE) IMPLANT
NEEDLE HYPO 25X1 1.5 SAFETY (NEEDLE) ×3 IMPLANT
NEEDLE INSUFFLATION 14GA 120MM (NEEDLE) ×3 IMPLANT
NS IRRIG 500ML POUR BTL (IV SOLUTION) ×3 IMPLANT
PACK LAP CHOLECYSTECTOMY (MISCELLANEOUS) ×3 IMPLANT
PENCIL ELECTRO HAND CTR (MISCELLANEOUS) ×3 IMPLANT
PROGRASP ENDOWRIST DA VINCI (INSTRUMENTS) ×2
PROGRASP ENDOWRIST DVNC (INSTRUMENTS) ×1 IMPLANT
RELOAD STAPLER WHITE 60MM (STAPLE) IMPLANT
SCISSORS METZENBAUM CVD 33 (INSTRUMENTS) ×3 IMPLANT
SLEEVE ENDOPATH XCEL 5M (ENDOMECHANICALS) ×6 IMPLANT
SOLUTION ELECTROLUBE (MISCELLANEOUS) ×3 IMPLANT
SPOGE SURGIFLO 8M (HEMOSTASIS) ×2
SPONGE LAP 4X18 5PK (MISCELLANEOUS) IMPLANT
SPONGE SURGIFLO 8M (HEMOSTASIS) ×1 IMPLANT
SPONGE VERSALON 4X4 4PLY (MISCELLANEOUS) IMPLANT
STAPLE ECHEON FLEX 60 POW ENDO (STAPLE) IMPLANT
STAPLER RELOAD WHITE 60MM (STAPLE)
STAPLER SKIN PROX 35W (STAPLE) ×3 IMPLANT
STRAP SAFETY BODY (MISCELLANEOUS) ×3 IMPLANT
SUT DVC VLOC 90 3-0 CV23 UNDY (SUTURE) IMPLANT
SUT DVC VLOC 90 3-0 CV23 VLT (SUTURE) ×3
SUT ETHILON 3-0 FS-10 30 BLK (SUTURE)
SUT MNCRL 4-0 (SUTURE) ×4
SUT MNCRL 4-0 27XMFL (SUTURE) ×2
SUT PROLENE 5 0 PS 3 (SUTURE) IMPLANT
SUT SILK 2 0 SH (SUTURE) ×3 IMPLANT
SUT VIC AB 0 CT1 36 (SUTURE) IMPLANT
SUT VIC AB 2-0 CT1 (SUTURE) ×6 IMPLANT
SUT VIC AB 2-0 SH 27 (SUTURE) ×2
SUT VIC AB 2-0 SH 27XBRD (SUTURE) ×1 IMPLANT
SUT VICRYL 0 AB UR-6 (SUTURE) IMPLANT
SUTURE DVC VLC 90 3-0 CV23 VLT (SUTURE) ×1 IMPLANT
SUTURE EHLN 3-0 FS-10 30 BLK (SUTURE) IMPLANT
SUTURE MNCRL 4-0 27XMF (SUTURE) ×2 IMPLANT
SYR BULB IRRIG 60ML STRL (SYRINGE) IMPLANT
SYRINGE 10CC LL (SYRINGE) ×3 IMPLANT
SYRINGE IRR TOOMEY STRL 70CC (SYRINGE) ×3 IMPLANT
TAPE CLOTH 10X20 WHT NS LF (TAPE) ×2 IMPLANT
TAPE CLOTH 2X10 WHT NS LF (TAPE) ×4
TOWEL OR 17X26 4PK STRL BLUE (TOWEL DISPOSABLE) ×3 IMPLANT
TROCAR DISP BLADELESS 8 DVNC (TROCAR) ×1 IMPLANT
TROCAR DISP BLADELESS 8MM (TROCAR) ×2
TROCAR ENDOPATH XCEL 12X100 BL (ENDOMECHANICALS) ×6 IMPLANT
TROCAR XCEL 12X100 BLDLESS (ENDOMECHANICALS) ×3 IMPLANT
TROCAR XCEL NON-BLD 5MMX100MML (ENDOMECHANICALS) ×3 IMPLANT
TUBING INSUFFLATOR HI FLOW (MISCELLANEOUS) ×3 IMPLANT

## 2016-12-09 NOTE — Progress Notes (Signed)
Pt attempted to ambulate to bathroom, but was unsuccessful because of dizziness and nausea. When pt sat on bed, he was visibly shaky and diaphoretic. BP and O2 sats were fine when checked. Pt was given oral pain medication for 6/10 pain.

## 2016-12-09 NOTE — Addendum Note (Signed)
Addended by: Jerl Mina on: 12/09/2016 02:04 PM   Modules accepted: Orders

## 2016-12-09 NOTE — Interval H&P Note (Signed)
History and Physical Interval Note:  12/09/2016 7:11 AM  Lee Graham  has presented today for surgery, with the diagnosis of PROSTATE CA  The various methods of treatment have been discussed with the patient and family. After consideration of risks, benefits and other options for treatment, the patient has consented to  Procedure(s): ROBOTIC [place] (Bilateral) as a surgical intervention .  The patient's history has been reviewed, patient examined, no change in status, stable for surgery.  I have reviewed the patient's chart and labs.  Questions were answered to the patient's satisfaction.    RRR CTAB  Nonnerve sparing approach   Hollice Espy

## 2016-12-09 NOTE — Op Note (Signed)
12/09/16  PREOPERATIVE DIAGNOSIS: Prostate cancer.  POSTOPERATIVE DIAGNOSIS: Prostate cancer.  OPERATION PERFORMED: 1. DaVinci laparoscopic radical prostatectomy (non nerve sparing) 2 DaVinci laproscopic bilateral pelvic lymph node dissection.  SURGEON: Hollice Espy, MD  ASSISTANTS: Liliane Bade, PA  ANESTHESIA: General.  EBL: 600 cc  SPECIMEN: Prostate with bilateral seminal vesicals, bilateral pelvic lymph nodes, anterior fat pad.  FINDINGS: Accessory Pudendal Vessel: Right-sided, unable to be spared   INDICATION: Pt.is a 66 year old male with Gleason 4+5 prostate cancer. Treatment options were discussed with him at length and he chose DaVinci radical prostatectomy.  Bilateral pelvic lymph node dissection was planned due to his risk stratification. Preoperative metastatic workup was negative.   PROCEDURE IN DETAIL: Patient was given Ancef preoperatively. He had sequential compression devices applied preoperatively for DVT prophylaxis. He was taken to the operating room where he was induced with general anesthesia. After adequate anesthesia, he was placed in the dorsal lithotomy position. His arms were draped by his side and was appropriately padded and secured to the operating room table. He was placed in the Trendelenburg position.  He was prepped and draped in sterile fashion. An 76 French Foley was placed in the bladder and instilled with 15 cc sterile water. Orogastric tube was placed. The Veress needle was passed just above the umbilicus and the abdomen was insufflated to 15 atmospheres. A 12 mm, blunt-tip trocar was placed just above the umbilicus. The zero-degree camera was passed within this and the following trocars were placed under direct vision; 8 mm robotic trocars were placed 9 cm laterally and inferiorly to the initially placed umbilical trocar. A third one was placed 7 cm lateral to the left-sided trocar. In the corresponding position on  the right side, a 12 mm trocar was placed, and then a 5 mm trocar was placed to the right and well above the umbilicus.  The robot was then docked with the robot trocar. I used the zero-degree camera. I had the hot scissors in the right hand and the left hand with the [place] bipolar and far left hand the Prograsp forceps. Initially I divided the median umbilical ligament bilaterally and the urachus and developed the space of Retzius down to pubic bone. I divided the parietal peritoneum laterally up to the vas deferens on each side. I used the Cardier forceps to provide cranial traction on the urachus. I cleaned off the Endopelvic fascia on each side and then divided it with the scissors laterally to the perirectal fat and medially to the puboprostatic ligaments which were divided. I then ligated the dorsal vein complex using a 2-0 Vicryl suture on a CT1.  I then addressed the bladder neck with a 30-degree down lens. I identified the bladder neck by pulling on the Foley catheter. I divided the anterior bladder neck musculature until I then found the anterior bladder neck mucosa which was incised. I identified the Foley catheter within, deflated the balloon, pulled the Foley out through this opening and then using the Carter-Thomason needle with a #0-Vicryl suture, passed through The suprapubic region and pulled the suture through the eye of the Foley and then back out. This allowed me to provide upward traction on the prostate. I then divided the lateral bladder neck mucosa and the posterior bladder neck mucosa. I was well away from ureteral orifices. I divided the posterior bladder neck musculature until I identified the vas deferens. They were freed proximally, then divided. I freed up the seminal vesicals using blunt and sharp dissection.  I then  went back to the 0-degree lens. I divided the Denonvilliers fascia beneath the prostate and developed the prostate off the rectum. I  then did a bilateral partial nerve sparing by  creating a plane which was intrafascial. I then isolated the pedicles of the prostate and placed weck clips on the pedicles of the prostate and then divided it with cold scissors.  A non-nerve sparing approach was used given the extent of his disease.  At this point the prostate was freed up except for the urethra. I addressed the prostate anteriorly, divided the dorsal vein , then the anterior urethral wall, pulled the Foley catheter back and then divided the posterior urethral wall. Specimen was completely freed up. I placed the prostate in an Endo catch bag and then placed the bag in the upper abdomen out of the way. I then irrigated the pelvis. The rectal test was negative. There was reasonable hemostasis.  I then did the pelvic lymph node dissection by incising the fascia overlying the right external iliac vein, dissecting distally. I went just distal to the node of [place] where we placed clips and then divided the lymphatics. The lateral aspect of the dissection was the pelvic side wall, inferior was the obturator nerve and proximal the hypogastric vessels. I placed clips at the proximal aspect and then divided the lymphatics. This was removed with the spoon grasper and sent to pathology. Surgicel was placed into the obturator fossa for additional hemostasis.  I then did the left obturator lymph node dissection in the same fashion as the left side.  With good hemostasis, I then did the posterior reconstruction. I used a 3-0 VLoc suture on an RB1 through the cut edge of Denonvilliers fascia beneath the bladder on the right side and through the posterior striated sphincter underneath the urethra. This brought the bladder neck and urethra and closer proximity to help facilitate anastomosis.   I then did the urethral vesicle anastomosis again with two 3-0 VLoc sutures on an RB1 needle interlocked. I passed both ends of the suture from  the outside-in through the bladder neck at the 6 o'clock position. I passed both through the urethral stump from the inside-out in the corresponding position. I reapproximated the bladder neck to the urethra. I then ran the Left suture on the left side anastomosis to the 9 o'clock position. Then I went back to the right sided suture and ran that up the right side to the 12 o'clock position. I then continued the left suture to the 12 o'clock position.The suture was then suspended anteriorly behind the pubic bone.   I then placed a new 56 French Foley into the bladder and filled it with 15 cc sterile water. I irrigated the bladder with 160 cc. There was no leakage. There was reasonable hemostasis.  A 19 French round Blake drain was used at the fourth arm site and placed in the dependent portion of the pelvis.  Surgiflo was used on either side of the pedicles for an additional hemostasis.  The instruments were then removed. The robot was undocked and all the trocars were removed under direct vision. There was good hemostasis. I then enlarged the umbilical trocar site large enough to remove the prostate and I closed the fascia here with #0-0 Vicryl suture in a running fashion. All the port sites were irrigated. Lidocaine was injected into all the trocar sites. The skin was closed with 4-0 Monocryl in running subcuticular fashion. Dermabond was applied.   At this point patient  was awakened and extubated in the operating room and taken to the recovery room in stable condition. There were no complications. All counts correct.  Liliane Bade is crucial for completion of this case and assisted with numerous aspects including patient positioning, draping, trocar placement, suture passing, aspiration, specimen extraction, closure, amongst others. Without his assistance, surgery would not be able to be performed.   Hollice Espy, MD

## 2016-12-09 NOTE — OR Nursing (Signed)
Left hearing aid in place when transporting to PACU

## 2016-12-09 NOTE — Transfer of Care (Signed)
Immediate Anesthesia Transfer of Care Note  Patient: Lee Graham  Procedure(s) Performed: Procedure(s): ROBOTIC ASSISTED LAPAROSCOPIC RADICAL PROSTATECTOMY WITH BILATERAL PELVIC NODE DISSECTION (N/A)  Patient Location: PACU  Anesthesia Type:General  Level of Consciousness: sedated  Airway & Oxygen Therapy: Patient Spontanous Breathing and Patient connected to face mask oxygen  Post-op Assessment: Report given to RN and Post -op Vital signs reviewed and stable  Post vital signs: Reviewed and stable  Last Vitals:  Vitals:   12/09/16 0610 12/09/16 1323  BP: 123/67 140/84  Pulse: 64 92  Resp: 18 16  Temp: 36.6 C (!) 36.2 C    Last Pain:  Vitals:   12/09/16 0610  TempSrc: Tympanic  PainSc: 0-No pain         Complications: No apparent anesthesia complications

## 2016-12-09 NOTE — Anesthesia Post-op Follow-up Note (Cosign Needed)
Anesthesia QCDR form completed.        

## 2016-12-09 NOTE — Anesthesia Procedure Notes (Signed)
Procedure Name: Intubation Date/Time: 12/09/2016 7:54 AM Performed by: Allean Found Pre-anesthesia Checklist: Patient identified, Emergency Drugs available, Suction available, Patient being monitored and Timeout performed Patient Re-evaluated:Patient Re-evaluated prior to inductionOxygen Delivery Method: Circle system utilized Preoxygenation: Pre-oxygenation with 100% oxygen Intubation Type: IV induction Ventilation: Mask ventilation without difficulty Laryngoscope Size: Mac and 4 Grade View: Grade I Tube type: Oral Tube size: 7.5 mm Number of attempts: 1 Airway Equipment and Method: Stylet Placement Confirmation: ETT inserted through vocal cords under direct vision,  positive ETCO2 and breath sounds checked- equal and bilateral Secured at: 22 cm Tube secured with: Tape Dental Injury: Teeth and Oropharynx as per pre-operative assessment

## 2016-12-09 NOTE — Anesthesia Postprocedure Evaluation (Signed)
Anesthesia Post Note  Patient: Lee Graham  Procedure(s) Performed: Procedure(s) (LRB): ROBOTIC ASSISTED LAPAROSCOPIC RADICAL PROSTATECTOMY WITH BILATERAL PELVIC NODE DISSECTION (N/A)  Patient location during evaluation: PACU Anesthesia Type: General Level of consciousness: awake and alert Pain management: pain level controlled Vital Signs Assessment: post-procedure vital signs reviewed and stable Respiratory status: spontaneous breathing and respiratory function stable Cardiovascular status: stable Anesthetic complications: no     Last Vitals:  Vitals:   12/09/16 0610 12/09/16 1323  BP: 123/67 140/84  Pulse: 64 92  Resp: 18 16  Temp: 36.6 C (!) 36.2 C    Last Pain:  Vitals:   12/09/16 0610  TempSrc: Tympanic  PainSc: 0-No pain                 KEPHART,WILLIAM K

## 2016-12-09 NOTE — Anesthesia Preprocedure Evaluation (Signed)
Anesthesia Evaluation  Patient identified by MRN, date of birth, ID band Patient awake    Reviewed: Allergy & Precautions, NPO status , Patient's Chart, lab work & pertinent test results  History of Anesthesia Complications Negative for: history of anesthetic complications  Airway Mallampati: II       Dental  (+) Upper Dentures, Lower Dentures   Pulmonary neg pulmonary ROS, former smoker,           Cardiovascular hypertension, Pt. on medications and Pt. on home beta blockers      Neuro/Psych neg Seizures    GI/Hepatic Neg liver ROS, PUD, neg GERD  ,  Endo/Other  neg diabetes  Renal/GU negative Renal ROS     Musculoskeletal   Abdominal   Peds  Hematology   Anesthesia Other Findings   Reproductive/Obstetrics                             Anesthesia Physical Anesthesia Plan  ASA: III  Anesthesia Plan: General   Post-op Pain Management:    Induction:   PONV Risk Score and Plan: 3 and Ondansetron, Dexamethasone, Propofol and Midazolam  Airway Management Planned: Oral ETT  Additional Equipment:   Intra-op Plan:   Post-operative Plan:   Informed Consent: I have reviewed the patients History and Physical, chart, labs and discussed the procedure including the risks, benefits and alternatives for the proposed anesthesia with the patient or authorized representative who has indicated his/her understanding and acceptance.     Plan Discussed with:   Anesthesia Plan Comments:         Anesthesia Quick Evaluation

## 2016-12-09 NOTE — H&P (View-Only) (Signed)
11/13/2016 4:56 PM   Lanice Shirts [birth year] [id number]  Referring provider: Albina Billet, MD [address]   [place], [place] [address]   HPI: 66 year old male who presents today to discuss his newly diagnosed prostate cancer.  He underwent prostate biopsy on 11/01/2016 for PSA of 10.7.  Rectal exam was also suspicious for nodularity at the right apex.  TRUS volume 67 gm.  there was evidence of slight intravesical protrusion with a small median lobe.  Prostate biopsy was positive for 8/12 lesions of prostate cancer, the most aggressive appearing tumor on the left lateral biopsies, Gleason 4+5 involving a total of 3 cores on the side involving up to 31%. The remainder of the cores were diffuse throughout the gland, Gleason 3+3.  He did undergo further staging in the form of CT abdomen pelvis with contrast as well as bone scan. These are both negative for any evidence of metastatic disease.  He is fairly healthy and takes no anticoagulants.  He does hake ASA 81 mg for prevention.   No previous abdominal surgeries.   BMI 29.    He does have severe baseline erectile dysfunction. IPSS and SHIM as below.  MSK nomogram today, overall survival 99%/98%, , progression free survival 32%/20%, organ confined disease 16%, extracapsular extension 82%, lymph nodes 28%, seminal vesicles 32%.      IPSS    Row Name 11/13/16 1300         International Prostate Symptom Score   How often have you had the sensation of not emptying your bladder? Not at All     How often have you had to urinate less than every two hours? About half the time     How often have you found you stopped and started again several times when you urinated? Less than 1 in 5 times     How often have you found it difficult to postpone urination? Less than 1 in 5 times     How often have you had a weak urinary stream? Less than half the time     How often have you had to strain to start urination? Not at All     How  many times did you typically get up at night to urinate? 2 Times     Total IPSS Score 9       Quality of Life due to urinary symptoms   If you were to spend the rest of your life with your urinary condition just the way it is now how would you feel about that? Mixed        Score:  1-7 Mild 8-19 Moderate 20-35 Severe      SHIM    Row Name 11/13/16 1327         SHIM: Over the last 6 months:   How do you rate your confidence that you could get and keep an erection? Very Low     When you had erections with sexual stimulation, how often were your erections hard enough for penetration (entering your partner)? Most Times (much more than half the time)     During sexual intercourse, how often were you able to maintain your erection after you had penetrated (entered) your partner? Most Times (much more than half the time)     During sexual intercourse, how difficult was it to maintain your erection to completion of intercourse? Slightly Difficult     When you attempted sexual intercourse, how often was it satisfactory for you?  Most Times (much more than half the time)       SHIM Total Score   SHIM 17         PMH: Past Medical History:  Diagnosis Date  . Arthritis   . Cancer ([place])   . Deafness in left ear    ONLY 30% HEARING IN RIGHT EAR  . Hyperlipidemia   . Hypertension     Surgical History: Past Surgical History:  Procedure Laterality Date  . COLONOSCOPY WITH PROPOFOL N/A 08/30/2015   Procedure: COLONOSCOPY WITH PROPOFOL;  Surgeon: Christene Lye, MD;  Location: [facility] ENDOSCOPY;  Service: Endoscopy;  Laterality: N/A;  . FINGER SURGERY Right     Home Medications:  Allergies as of 11/13/2016   No Known Allergies     Medication List       Accurate as of 11/13/16  4:56 PM. Always use your most recent med list.          aspirin 81 MG tablet Take 81 mg by mouth daily.   metoprolol succinate 100 MG 24 hr tablet Commonly known as:  TOPROL-XL Take 100 mg by  mouth every morning.   ROLAIDS PO Take 1 tablet by mouth as needed.   simvastatin 40 MG tablet Commonly known as:  ZOCOR Take 40 mg by mouth daily at 6 PM.   telmisartan 40 MG tablet Commonly known as:  MICARDIS Take 40 mg by mouth every morning.   triamterene-hydrochlorothiazide 37.5-25 MG capsule Commonly known as:  DYAZIDE       Allergies: No Known Allergies  Family History: Family History  Problem Relation Age of Onset  . Colon cancer Father   . Prostate cancer Neg Hx   . Bladder Cancer Neg Hx   . Kidney cancer Neg Hx     Social History:  reports that he quit smoking about 46 years ago. His smoking use included Cigars. He has quit using smokeless tobacco. He reports that he does not drink alcohol or use drugs.  ROS: UROLOGY Frequent Urination?: No Hard to postpone urination?: No Burning/pain with urination?: No Get up at night to urinate?: No Leakage of urine?: No Urine stream starts and stops?: No Trouble starting stream?: No Do you have to strain to urinate?: No Blood in urine?: No Urinary tract infection?: No Sexually transmitted disease?: No Injury to kidneys or bladder?: No Painful intercourse?: No Weak stream?: No Erection problems?: No Penile pain?: No  Gastrointestinal Nausea?: No Vomiting?: No Indigestion/heartburn?: No Diarrhea?: No Constipation?: No  Constitutional Fever: No Night sweats?: No Weight loss?: No Fatigue?: No  Skin Skin rash/lesions?: No Itching?: No  Eyes Blurred vision?: No Double vision?: No  Ears/Nose/Throat Sore throat?: No Sinus problems?: No  Hematologic/Lymphatic Swollen glands?: No Easy bruising?: No  Cardiovascular Leg swelling?: No Chest pain?: No  Respiratory Cough?: No Shortness of breath?: No  Endocrine Excessive thirst?: No  Musculoskeletal Back pain?: No Joint pain?: No  Neurological Headaches?: No Dizziness?: No  Psychologic Depression?: No Anxiety?: No  Physical  Exam: BP (!) 112/59   Pulse 96   Ht 6\' 2"  (1.88 m)   Wt 230 lb (104.3 kg)   BMI 29.53 kg/m   Constitutional:  Alert and oriented, No acute distress.  Presents with wife today. HEENT: [place] AT, moist mucus membranes.  Trachea midline, no masses. Cardiovascular: No clubbing, cyanosis, or edema. Respiratory: Normal respiratory effort, no increased work of breathing. GI: Abdomen is soft, nontender, nondistended, no abdominal masses.  Protuberant with tiny umbilical hernia.  No  abdominal scars. GU: No CVA tenderness.  Skin: No rashes, bruises or suspicious lesions. Neurologic: Grossly intact, no focal deficits, moving all 4 extremities. Psychiatric: Normal mood and affect.  He is appropriately tearful at times when discussing diagnosis.  Laboratory Data:  Lab Results  Component Value Date   CREATININE 1.40 (H) 11/12/2016   PSA as above  Urinalysis N/a  Pertinent Imaging: CLINICAL DATA:  Prostate cancer staging, Gleason 4 + 5 = 9.  EXAM: CT ABDOMEN AND PELVIS WITH CONTRAST  TECHNIQUE: Multidetector CT imaging of the abdomen and pelvis was performed using the standard protocol following bolus administration of intravenous contrast.  CONTRAST:  160mL ISOVUE-300 IOPAMIDOL (ISOVUE-300) INJECTION 61%  COMPARISON:  None.  FINDINGS: Lower chest: Unremarkable  Hepatobiliary: Diffuse hepatic steatosis.  Pancreas: Unremarkable  Spleen: Unremarkable  Adrenals/Urinary Tract: Adrenal glands normal. Several tiny hypodense lesions of the right kidney are technically too small to characterize although statistically likely to be cysts.  Stomach/Bowel: Unremarkable  Vascular/Lymphatic: Aortoiliac atherosclerotic vascular disease. No pathologic adenopathy identified.  Reproductive: The prostate gland measures 6.0 by 4.8 by 5.7 cm (volume = 86 cm^3). Seminal vesicles appear symmetric. Slight prominence of venous structures in the upper scrotum bilaterally, cannot  exclude varicoceles.  Other: No supplemental non-categorized findings.  Musculoskeletal: Severe arthropathy of the right hip and moderate arthropathy of the left hip, with prominent spurring and loss of articular space, and some early flattening of the right femoral head. Sclerosis with central lucency anteriorly in the right acetabulum, likely from degenerative or erosive arthropathy. Fragmented spurring of the right anterior acetabulum.  No compelling findings of osseous metastatic disease. Mild to moderate left foraminal stenosis at L5-S1 due to facet spurring.  IMPRESSION: 1. No adenopathy or specific evidence of metastatic prostate cancer in the abdomen or pelvis. Prostate volume estimated at 86 cubic cm. 2. Other imaging findings of potential clinical significance: Right greater than left hip arthropathy. Lumbar spondylosis and degenerative disc disease likely causing left foraminal impingement at L5-S1. Aortic Atherosclerosis (ICD10-I70.0). Diffuse hepatic steatosis. Possible scrotal varicoceles bilaterally.   Electronically Signed   By: Van Clines M.D.   On: 11/12/2016 14:55  CLINICAL DATA:  66 year old with new diagnosis of prostate cancer. Patient complains of right knee pain and low back pain. Initial staging examination.  EXAM: NUCLEAR MEDICINE WHOLE BODY BONE SCAN  TECHNIQUE: Whole body anterior and posterior images were obtained approximately 3 hours after intravenous injection of radiopharmaceutical.  RADIOPHARMACEUTICALS:  24.5 mCi Technetium-41m MDP IV.  COMPARISON:  No prior nuclear imaging. Bone window images from CT abdomen and pelvis 11/12/2016.  FINDINGS: No abnormal osseous activity to suggest metastatic disease.  Degenerative uptake is present involving multiple joints including:  Bilateral hips, right greater than left, as noted on the CT earlier today.  Bilateral knees.  Bilateral glenohumeral  joints.  Bilateral acromioclavicular joints.  Bilateral sternoclavicular joints.  Bilateral hands, wrists and elbows (visualized portions).  Thoracic and lumbar spine.  Activity is present in the calcified costal cartilages of the first ribs bilaterally and there is scattered activity involving other costal cartilages.  IMPRESSION: 1. No evidence of osseous metastatic disease. 2. Degenerative activity involving multiple joints as detailed above.   Electronically Signed   By: Evangeline Dakin M.D.   On: 11/12/2016 16:07  Assessment & Plan:    1. Prostate cancer [facility]) 66 year old male with T2a high risk prostate cancer, Gleason 4+5 on the left as well as 3+3 bilaterally.  Staging including CT scan and bone scan show no obvious  evidence of metastatic disease.  The patient was counseled about the natural history of prostate cancer and the standard treatment options that are available for prostate cancer. It was explained to him how his age and life expectancy, clinical stage, Gleason score, and PSA affect his prognosis, the decision to proceed with additional staging studies, as well as how that information influences recommended treatment strategies. We discussed the roles for active surveillance, radiation therapy, surgical therapy, androgen deprivation, as well as ablative therapy options for the treatment of prostate cancer as appropriate to his individual cancer situation. We discussed the risks and benefits of these options with regard to their impact on cancer control and also in terms of potential adverse events, complications, and impact on quality of life particularly related to urinary, bowel, and sexual function. The patient was encouraged to ask questions throughout the discussion today and all questions were answered to his stated satisfaction. In addition, the patient was providedwith and/or directed to appropriate resources and literature for further education about  prostate cancer treatment options.  We discussed surgical therapy for prostate cancer including the different available surgical approaches. We discussed, in detail, the risks and expectations of surgery with regard to cancer control, urinary control, and erectile dysfunction as well as expected post operative cover he processed. Additional risks of surgery including but not limalited to bleeding, infection, hernia formation, nerve damage, steel formation, bowel/rect injury, potentially necessitating colostomy, damage to the urinary tract resulting in urinary leakage, urethral stricture, and cardiopulmonary risk such as myocardial infarction, stroke, death, thromboembolism etc. were explained. The risk of open surgical conversion for robotics/laparoscopic prostatectomy is also discussed.  Specifically today,  MSK nomogram was reviewed. He has a very high chance of having extracapsular extension, involvement of the neurovascular bundle, seminal vesicles, and lymph nodes. Despite this, given his relatively young age and aggressive tumor, I would highly recommend consideration of radical cystoprostatectomy with bilateral lymph node dissection. He understands that this may or may not be curative and may need adjuvant therapy in the form of salvage/ adjvant radiation or possibly even ADT pending pathology.  After our discussion, he is most likely interested in having surgery first. I would like him to go see Dr. Berton Mount for a second opinion as well as to discuss adjuvant/salvage therapies down the road if needed.  I'll also refer him to the pelvic floor therapy prior to any surgical intervention.  - Ambulatory referral to Radiation Oncology - Ambulatory referral to Physical Therapy   Hollice Espy, MD  [place] [address], [place], [place] [address] [phone]  I spent 40 min with this patient of which greater than 50% was spent in counseling and  coordination of care with the patient.

## 2016-12-10 ENCOUNTER — Encounter: Payer: Self-pay | Admitting: Urology

## 2016-12-10 DIAGNOSIS — C61 Malignant neoplasm of prostate: Secondary | ICD-10-CM | POA: Diagnosis not present

## 2016-12-10 LAB — BASIC METABOLIC PANEL
Anion gap: 9 (ref 5–15)
BUN: 21 mg/dL — AB (ref 6–20)
CALCIUM: 8 mg/dL — AB (ref 8.9–10.3)
CO2: 24 mmol/L (ref 22–32)
CREATININE: 1.43 mg/dL — AB (ref 0.61–1.24)
Chloride: 102 mmol/L (ref 101–111)
GFR calc Af Amer: 57 mL/min — ABNORMAL LOW (ref >60)
GFR, EST NON AFRICAN AMERICAN: 49 mL/min — AB (ref >60)
GLUCOSE: 131 mg/dL — AB (ref 65–99)
Potassium: 3.7 mmol/L (ref 3.5–5.1)
Sodium: 135 mmol/L (ref 135–145)

## 2016-12-10 LAB — HEMOGLOBIN AND HEMATOCRIT, BLOOD
HCT: 32.3 % — ABNORMAL LOW (ref 40.0–52.0)
Hemoglobin: 11.3 g/dL — ABNORMAL LOW (ref 13.0–18.0)

## 2016-12-10 MED ORDER — DOCUSATE SODIUM 100 MG PO CAPS: 100.0000 mg | ORAL_CAPSULE | Freq: Two times a day (BID) | ORAL | 0 refills | Status: DC | PRN

## 2016-12-10 MED ORDER — OXYCODONE-ACETAMINOPHEN 5-325 MG PO TABS: 1.0000 | ORAL_TABLET | ORAL | 0 refills | Status: DC | PRN

## 2016-12-10 MED ORDER — OXYBUTYNIN CHLORIDE 5 MG PO TABS: 5.0000 mg | ORAL_TABLET | Freq: Three times a day (TID) | ORAL | 0 refills | Status: DC | PRN

## 2016-12-10 NOTE — Discharge Instructions (Signed)
·   Activity:  You are encouraged to ambulate frequently (about every hour during waking hours) to help prevent blood clots from forming in your legs or lungs.  However, you should not engage in any heavy lifting (> 5-10 lbs), strenuous activity, or straining. ° °· Diet: You should advance your diet as instructed by your physician.  It will be normal to have some bloating, nausea, and abdominal discomfort intermittently. ° °· Prescriptions:  You will be provided a prescription for pain medication to take as needed.  If your pain is not severe enough to require the prescription pain medication, you may take extra strength Tylenol instead which will have less side effects.  You should also take a prescribed stool softener to avoid straining with bowel movements as the prescription pain medication may constipate you. ° °· Incisions: You may remove your dressing bandages 48 hours after surgery if not removed in the hospital.  You will either have some small staples or special tissue glue at each of the incision sites. Once the bandages are removed (if present), the incisions may stay open to air.  You may start showering (but not soaking or bathing in water) the 2nd day after surgery and the incisions simply need to be patted dry after the shower.  No additional care is needed. ° °What to call us about: You should call the office if you develop fever > 101 or develop persistent vomiting, redness or draining around your incision, or any other concerning symptoms.   ° °[facility] °[address], [address] °[place], [place] [address] °[phone] ° ° °

## 2016-12-10 NOTE — Progress Notes (Signed)
Discharged patient home with wife, ,reviewed discharge medications, appoints, foley care and how to access [place] with patient and his wife,

## 2016-12-10 NOTE — Discharge Summary (Signed)
Date of admission: 12/09/2016  Date of discharge: 12/10/2016  Admission diagnosis: Prostate cancer  Discharge diagnosis: prostate cancer  Secondary diagnoses:  Patient Active Problem List   Diagnosis Date Noted  . Prostate cancer ([place]) 12/09/2016  . HLD (hyperlipidemia) 10/16/2016  . Hypertension 10/16/2016  . CTS (carpal tunnel syndrome) 07/31/2010  . Crushing injury of right index finger 06/06/2010  . 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 Briefly, Lee Graham is a 66 y.o. year old patient with Gleason 4+5 prostate cancer who underwent radical robotic prostatectomy and bilateral pelvic lymph node dissection.   Hospital Course: Patient tolerated the procedure well.  He was then transferred to the floor after an uneventful PACU stay.  His hospital course was uncomplicated.  On POD#1  he had met discharge criteria: was eating a regular diet, was up and ambulating independently,  pain was well controlled, his drain was removed, and was ready to for discharge.    Physical Exam  Constitutional: He is oriented to person, place, and time. He appears well-developed.  HENT:  Head: Normocephalic and atraumatic.  Cardiovascular: Normal rate.   Abdominal: Soft. He exhibits no distension.  Wounds clean dry and intact. JP draining scant serosanguineous fluid.  Genitourinary: Penis normal.  Genitourinary Comments: Foley catheter in place during clear urine.  Neurological: He is alert and oriented to person, place, and time.  Skin: Skin is warm and dry.  Psychiatric: He has a normal mood and affect.  Vitals reviewed.    Laboratory values:   Recent Labs  12/10/16 0519  HGB 11.3*  HCT 32.3*    Recent Labs  12/10/16 0519  NA 135  K 3.7  CL 102  CO2 24  GLUCOSE 131*  BUN 21*  CREATININE 1.43*  CALCIUM 8.0*   No results for input(s): LABPT, INR in the last 72 hours. No  results for input(s): LABURIN in the last 72 hours. Results for orders placed or performed during the hospital encounter of 11/29/16  Urine culture     Status: None   Collection Time: 11/29/16 10:11 AM  Result Value Ref Range Status   Specimen Description URINE, CLEAN CATCH  Final   Special Requests NONE  Final   Culture   Final    NO GROWTH Performed at [facility], [place] [address] [place], [place] [address]    Report Status 11/30/2016 FINAL  Final    Disposition: Home  Discharge instruction: The patient was instructed to be ambulatory but told to refrain from heavy lifting, strenuous activity, or driving while taking narcotics and with Foley.  Discharge medications: Allergies as of 12/10/2016   No Known Allergies     Medication List    TAKE these medications   aspirin EC 81 MG tablet Take 81 mg by mouth daily.   docusate sodium 100 MG capsule Commonly known as:  COLACE Take 1 capsule (100 mg total) by mouth 2 (two) times daily as needed (take to keep stool soft.).   metoprolol succinate 100 MG 24 hr tablet Commonly known as:  TOPROL-XL Take 50 mg by mouth daily. Takes 0.5 tablet in am.   oxybutynin 5 MG tablet Commonly known as:  DITROPAN Take 1 tablet (5 mg total) by mouth every 8 (eight) hours as needed for bladder spasms.   oxyCODONE-acetaminophen 5-325 MG tablet Commonly known as:  PERCOCET Take 1-2 tablets by mouth every 4 (four)  hours as needed for moderate pain or severe pain.   simvastatin 40 MG tablet Commonly known as:  ZOCOR Take 40 mg by mouth daily at 6 PM.   telmisartan 40 MG tablet Commonly known as:  MICARDIS Take 20 mg by mouth daily. Pt takes 0.5 tablet in am.   triamterene-hydrochlorothiazide 37.5-25 MG capsule Commonly known as:  DYAZIDE Take 1 capsule by mouth daily.       Resume ASA 81 mg tomorrow.    Followup:  Follow-up Information    Hollice Espy, MD In 1 week.   Specialty:  Urology Why:  voiding trial (nurse  visit) Contact information: [address] [place] [address] [phone]

## 2016-12-10 NOTE — Care Management Obs Status (Signed)
[facility] NOTIFICATION   Patient Details  Name: Lee Graham MRN: [id number] Date of Birth: November 05, 1950   Medicare Observation Status Notification Given:  No (admitted obs less than 24 hours)    Beverly Sessions, RN 12/10/2016, 1:44 PM

## 2016-12-16 ENCOUNTER — Other Ambulatory Visit: Payer: Self-pay | Admitting: Pathology

## 2016-12-16 LAB — SURGICAL PATHOLOGY

## 2016-12-17 NOTE — Progress Notes (Addendum)
  Fill and Pull Catheter Removal  Patient is present today for a catheter removal.  Patient was cleaned and prepped in a sterile fashion 266ml of sterile water/ saline was instilled into the bladder when the patient felt the urge to urinate. 38ml of water was then drained from the balloon.  A 16FR foley cath was removed from the bladder no complications were noted .  Patient as then given some time to void on their own.  Patient can void  114ml on their own after some time.  Patient instructed to go home, drink plenty of fluids, and to return @ 3pm for PVR. Patient verbalized understanding.    Follow up/ Additional notes: @ 3pm today  Patient returned: Bladder Scan: Patient can void: 0 ml Performed By: C. Corinna Capra, CMA  Follow up/Additional notes:  OV w/ Dr. Erlene Quan in 1 month.

## 2016-12-17 NOTE — Addendum Note (Signed)
Addended by: Leia Alf on: 12/17/2016 03:51 PM   Modules accepted: Level of Service

## 2016-12-30 ENCOUNTER — Telehealth: Payer: Self-pay

## 2016-12-30 NOTE — Telephone Encounter (Signed)
Pt wife called stating pt had a prostatectomy and about a week and half ago the catheter was removed. Wife then stated that pt is not having the sensation or urge to urinate but urine just "flows when it wants to." Reinforced with wife this is a very common problem post surgery and can take several months to see improvement. Wife then stated that pt is doing exercises PT has recommended. Reinforced with wife for pt to continue to work with PT and do exercises but again can still take several months to see improvement. Wife was unhappy but voiced understanding.

## 2016-12-31 NOTE — Telephone Encounter (Signed)
This encounter was created in error - please disregard.

## 2017-01-13 ENCOUNTER — Other Ambulatory Visit: Payer: Self-pay

## 2017-01-13 DIAGNOSIS — C61 Malignant neoplasm of prostate: Secondary | ICD-10-CM

## 2017-01-14 ENCOUNTER — Other Ambulatory Visit: Payer: Medicare Other

## 2017-01-14 DIAGNOSIS — C61 Malignant neoplasm of prostate: Secondary | ICD-10-CM

## 2017-01-15 LAB — PSA: PROSTATE SPECIFIC AG, SERUM: 0.2 ng/mL (ref 0.0–4.0)

## 2017-01-17 ENCOUNTER — Encounter: Payer: Self-pay | Admitting: Urology

## 2017-01-17 ENCOUNTER — Ambulatory Visit (INDEPENDENT_AMBULATORY_CARE_PROVIDER_SITE_OTHER): Payer: Medicare Other | Admitting: Urology

## 2017-01-17 VITALS — BP 148/78 | HR 105 | Ht 74.0 in | Wt 218.0 lb

## 2017-01-17 DIAGNOSIS — I950 Idiopathic hypotension: Secondary | ICD-10-CM

## 2017-01-17 DIAGNOSIS — N393 Stress incontinence (female) (male): Secondary | ICD-10-CM

## 2017-01-17 DIAGNOSIS — N5201 Erectile dysfunction due to arterial insufficiency: Secondary | ICD-10-CM

## 2017-01-17 DIAGNOSIS — C61 Malignant neoplasm of prostate: Secondary | ICD-10-CM

## 2017-01-17 NOTE — Progress Notes (Signed)
01/17/2017 1:36 PM   Lee Graham Apr 28, 1951 [id number]  Referring provider: Albina Billet, MD [address]   [place],  [address]  Chief Complaint  Patient presents with  . Prostate Cancer    1 month post op    HPI: 66 year old male with high-risk prostate cancer status post robotic radical prostatectomy with bilateral pelvic lymph node dissection on 12/19/2016.  Surgical pathology consistent with Gleason 3+5, focal extraprostatic extension, right seminal vesicle involvement, and microscopic bladder neck invasion all present. Surgical margins negative. Negative lymph nodes.  pT2bN0 M0.    PSA today is detectable, 0.2 ng/dL. iPSA 10.7ng/ dL.  Preoperative staging including CT scan abdomen and pelvis with contrast as well as bone scan negative.  Today, he is quite discouraged about his urinary control. Overall, things are improving but he continues to leak significantly. He has no issues when sitting but when he rises to stand, he has gross incontinence and is saturated. He has high expectations for hygiene and this is extremely bothersome to him. He is wearing diapers. Initially postop, he didn't have any sensation to void and was grossly incontinent. Now, he does get the urge and will go to the bathroom as needed.  He does have severe baseline erectile dysfunction, previously failed PDE 5 inhibitors.  He also has some concerns today about his blood pressure. Just prior to discharge, he did have hypotension which she relates to medication air. This resolved prior to discharge. He reports several other incidences of low blood pressure at home postop. His BP today is normal.   PMH: Past Medical History:  Diagnosis Date  . Arthritis   . Cancer ([place]) 11/13/2016   prostate  . Deafness in left ear    ONLY 30% HEARING IN RIGHT EAR  . Hyperlipidemia   . Hypertension     Surgical History: Past Surgical History:  Procedure Laterality Date  . COLONOSCOPY WITH  PROPOFOL N/A 08/30/2015   Procedure: COLONOSCOPY WITH PROPOFOL;  Surgeon: Christene Lye, MD;  Location: [facility] ENDOSCOPY;  Service: Endoscopy;  Laterality: N/A;  . FINGER SURGERY Right   . ROBOT ASSISTED LAPAROSCOPIC RADICAL PROSTATECTOMY N/A 12/09/2016   Procedure: ROBOTIC ASSISTED LAPAROSCOPIC RADICAL PROSTATECTOMY WITH BILATERAL PELVIC NODE DISSECTION;  Surgeon: Hollice Espy, MD;  Location: [facility] ORS;  Service: Urology;  Laterality: N/A;    Home Medications:  Allergies as of 01/17/2017   No Known Allergies     Medication List       Accurate as of 01/17/17 11:59 PM. Always use your most recent med list.          aspirin EC 81 MG tablet Take 81 mg by mouth daily.   metoprolol succinate 100 MG 24 hr tablet Commonly known as:  TOPROL-XL Take 50 mg by mouth daily. Takes 0.5 tablet in am.   simvastatin 40 MG tablet Commonly known as:  ZOCOR Take 40 mg by mouth daily at 6 PM.   telmisartan 40 MG tablet Commonly known as:  MICARDIS Take 20 mg by mouth daily. Pt takes 0.5 tablet in am.   triamterene-hydrochlorothiazide 37.5-25 MG capsule Commonly known as:  DYAZIDE Take 1 capsule by mouth daily.       Allergies: No Known Allergies  Family History: Family History  Problem Relation Age of Onset  . Colon cancer Father   . Prostate cancer Neg Hx   . Bladder Cancer Neg Hx   . Kidney cancer Neg Hx     Social History:  reports  that he quit smoking about 46 years ago. His smoking use included Cigars. He has quit using smokeless tobacco. He reports that he does not drink alcohol or use drugs.  ROS: UROLOGY Frequent Urination?: Yes Hard to postpone urination?: No Burning/pain with urination?: No Get up at night to urinate?: No Leakage of urine?: Yes Urine stream starts and stops?: No Trouble starting stream?: No Do you have to strain to urinate?: No Blood in urine?: No Urinary tract infection?: No Sexually transmitted disease?: No Injury to kidneys or bladder?:  No Painful intercourse?: No Weak stream?: No Erection problems?: No Penile pain?: No  Gastrointestinal Nausea?: No Vomiting?: No Indigestion/heartburn?: No Diarrhea?: No Constipation?: No  Constitutional Fever: No Night sweats?: No Weight loss?: No Fatigue?: No  Skin Skin rash/lesions?: No Itching?: No  Eyes Blurred vision?: No Double vision?: No  Ears/Nose/Throat Sore throat?: No Sinus problems?: No  Hematologic/Lymphatic Swollen glands?: No Easy bruising?: No  Cardiovascular Leg swelling?: No Chest pain?: No  Respiratory Cough?: No Shortness of breath?: No  Endocrine Excessive thirst?: No  Musculoskeletal Back pain?: No Joint pain?: No  Neurological Headaches?: No Dizziness?: No  Psychologic Depression?: No Anxiety?: No  Physical Exam: BP (!) 148/78   Pulse (!) 105   Ht 6\' 2"  (1.88 m)   Wt 218 lb (98.9 kg)   BMI 27.99 kg/m   Constitutional:  Alert and oriented, No acute distress.  Accompanied by wife today. HEENT: [place] AT, moist mucus membranes.  Trachea midline, no masses. Cardiovascular: No clubbing, cyanosis, or edema. Respiratory: Normal respiratory effort, no increased work of breathing. GI: Abdomen is soft, nontender, nondistended, no abdominal masses.  Patient's healing well. Skin: No rashes, bruises or suspicious lesions. Neurologic: Grossly intact, no focal deficits, moving all 4 extremities. Psychiatric: Somewhat anxious.  Laboratory Data: Lab Results  Component Value Date   WBC 6.6 11/29/2016   HGB 11.3 (L) 12/10/2016   HCT 32.3 (L) 12/10/2016   MCV 84.7 11/29/2016   PLT 266 11/29/2016    Lab Results  Component Value Date   CREATININE 1.43 (H) 12/10/2016   .PSA as above  Urinalysis N/a  Pertinent Imaging: n/a  Assessment & Plan:    1. Prostate cancer [facility]) S/p RALP with BPLND 11/2016 pT3b N0 M0 High risk features with detectable PSA of 1 month, 0.2 ng/dL Recommend salvage radiation, optimally in 1-2 months  to improve oncologic outcomes but allow for some healing of his new anastomosis Case was discussed with radiation oncology as well as at tumor board  2. Stress incontinence, male Severe postoperative urinary incontinence but improving Continue pelvic floor therapy Reassured that continence will continue to improve gradually, options for management of persistent incontinence at 1 year reviewed  3. Erectile dysfunction due to arterial insufficiency Severe preop and now postop erectile dysfunction Options for intracavernosal injections discussed, we'll discuss further her next follow-up visit  4. Idiopathic hypotension Patient has concerns today about blood pressure although unremarkable in the office today I've encouraged him to follow up with his PCP or seek urgent medical attention if he symptomatic   Return in about 3 months (around 04/19/2017) for recheck.  Hollice Espy, MD  [facility] [address], [place], [place] [address] [phone]

## 2017-01-20 ENCOUNTER — Telehealth: Payer: Self-pay | Admitting: Urology

## 2017-01-20 NOTE — Telephone Encounter (Signed)
He has an app on the 29th   Michelle

## 2017-01-20 NOTE — Telephone Encounter (Signed)
-----   Message from Hollice Espy, MD sent at 01/19/2017  1:42 PM EDT ----- Can you follow up and make sure he see's Dr. Baruch Gouty?  I have already sent him and christy message.

## 2017-01-29 ENCOUNTER — Ambulatory Visit
Admission: RE | Admit: 2017-01-29 | Discharge: 2017-01-29 | Disposition: A | Discharge status: Home / Self Care | Payer: Medicare Other | Source: Ambulatory Visit | Attending: Radiation Oncology | Admitting: Radiation Oncology

## 2017-01-29 DIAGNOSIS — C61 Malignant neoplasm of prostate: Secondary | ICD-10-CM

## 2017-01-29 NOTE — Progress Notes (Signed)
Radiation Oncology Follow up Note  Name: Lee Graham   Date:   01/29/2017 MRN:  [id number] DOB: 08/13/1950    This 66 y.o. male presents to the clinic today for follow-up since robotic-assisted radical prostatectomy for a Gleason 9 adenocarcinoma the prostate with positive seminal vesicle invasion and persistent elevated PSA.  REFERRING PROVIDER: Albina Billet, MD  HPI: Patient is a 66 year old male originally consult and back in July 2018 when he presented with a PSA of 10.7. He underwent ultrasound-guided prostate biopsy showing 8 of 12 cores positive for adenocarcinoma 3 of them showing Gleason 9 (4+5). Workup including CT scan of abdomen and pelvis and bone scan were negative for metastatic disease. He had a large bulky prostate making seed implantation prohibited. He underwent a radical prostatectomy robotic-assisted. With bilateral pelvic node dissection on July 19. Again pathology was consistent with a Gleason 8 (3+5) it had focal extraprostatic extension and involvement of the right seminal vesicle. There is also microscopic bladder neck invasion. Surgical margins were negative all lymph nodes were negative. His PSA remained detectable at 0.2 performed 2 weeks prior. His major complaint is urinary incontinence and he does wear depends undergarments. Patient is doing some pelvic floor therapy. I've asked to evaluate him for possible salvage radiation therapy  COMPLICATIONS OF TREATMENT: present  FOLLOW UP COMPLIANCE: keeps appointments   PHYSICAL EXAM:  There were no vitals taken for this visit. Well-developed well-nourished patient in NAD. HEENT reveals PERLA, EOMI, discs not visualized.  Oral cavity is clear. No oral mucosal lesions are identified. Neck is clear without evidence of cervical or supraclavicular adenopathy. Lungs are clear to A&P. Cardiac examination is essentially unremarkable with regular rate and rhythm without murmur rub or thrill. Abdomen is benign with no  organomegaly or masses noted. Motor sensory and DTR levels are equal and symmetric in the upper and lower extremities. Cranial nerves II through XII are grossly intact. Proprioception is intact. No peripheral adenopathy or edema is identified. No motor or sensory levels are noted. Crude visual fields are within normal range.  RADIOLOGY RESULTS: No current films reviewed, pathology reports reviewed  PLAN: Present time I agree with going ahead with salvage radiation therapy. In my experience treating up to 7600 cGy using IM RT treatment planning and delivery to his prostatic fossa can again add almost 80% chance of overall long-term disease-free survival. Risks and benefits of treatment including possible worsening of his urinary incontinence, diarrhea increased lower urinary tract symptoms fatigue skin reaction alteration of blood counts all were discussed in detail. Based on his negative pelvic lymph node sampling do not think you need to treat his pelvic lymph nodes. I will set the patient up and about 3-4 weeks for treatment planning purposes to allow some further normalization of his urinary incontinence. I also would like to start the patient on a DT therapy and have ordered Lupron therapy for at least 18 months. Patient wife both comprehend her treatment plan well.  I would like to take this opportunity to thank you for allowing me to participate in the care of your patient.Armstead Peaks., MD

## 2017-01-30 ENCOUNTER — Other Ambulatory Visit: Payer: Self-pay | Admitting: *Deleted

## 2017-01-30 DIAGNOSIS — C61 Malignant neoplasm of prostate: Secondary | ICD-10-CM

## 2017-02-06 ENCOUNTER — Other Ambulatory Visit: Payer: Self-pay | Admitting: *Deleted

## 2017-02-06 DIAGNOSIS — C61 Malignant neoplasm of prostate: Secondary | ICD-10-CM

## 2017-02-11 ENCOUNTER — Ambulatory Visit
Admission: RE | Admit: 2017-02-11 | Discharge: 2017-02-11 | Disposition: A | Discharge status: Home / Self Care | Payer: Medicare Other | Source: Ambulatory Visit | Attending: Radiation Oncology | Admitting: Radiation Oncology

## 2017-02-11 ENCOUNTER — Other Ambulatory Visit: Payer: Self-pay | Admitting: *Deleted

## 2017-02-11 ENCOUNTER — Inpatient Hospital Stay: Payer: Medicare Other | Attending: Radiation Oncology

## 2017-02-11 DIAGNOSIS — Z79818 Long term (current) use of other agents affecting estrogen receptors and estrogen levels: Secondary | ICD-10-CM | POA: Insufficient documentation

## 2017-02-11 DIAGNOSIS — C61 Malignant neoplasm of prostate: Secondary | ICD-10-CM | POA: Diagnosis not present

## 2017-02-11 MED ORDER — LEUPROLIDE ACETATE (4 MONTH) 30 MG IM KIT
30.0000 mg | PACK | Freq: Once | INTRAMUSCULAR | Status: AC
  Administered 2017-02-11: 30 mg via INTRAMUSCULAR
  Filled 2017-02-11: qty 30

## 2017-02-14 ENCOUNTER — Other Ambulatory Visit: Payer: Self-pay | Admitting: *Deleted

## 2017-02-14 DIAGNOSIS — C61 Malignant neoplasm of prostate: Secondary | ICD-10-CM

## 2017-02-17 DIAGNOSIS — C61 Malignant neoplasm of prostate: Secondary | ICD-10-CM | POA: Diagnosis not present

## 2017-02-20 ENCOUNTER — Ambulatory Visit
Admission: RE | Admit: 2017-02-20 | Discharge: 2017-02-20 | Disposition: A | Discharge status: Home / Self Care | Payer: Medicare Other | Source: Ambulatory Visit | Attending: Radiation Oncology | Admitting: Radiation Oncology

## 2017-02-24 ENCOUNTER — Ambulatory Visit
Admission: RE | Admit: 2017-02-24 | Discharge: 2017-02-24 | Disposition: A | Discharge status: Home / Self Care | Payer: Medicare Other | Source: Ambulatory Visit | Attending: Radiation Oncology | Admitting: Radiation Oncology

## 2017-02-24 DIAGNOSIS — C61 Malignant neoplasm of prostate: Secondary | ICD-10-CM | POA: Diagnosis not present

## 2017-02-25 ENCOUNTER — Ambulatory Visit
Admission: RE | Admit: 2017-02-25 | Discharge: 2017-02-25 | Disposition: A | Discharge status: Home / Self Care | Payer: Medicare Other | Source: Ambulatory Visit | Attending: Radiation Oncology | Admitting: Radiation Oncology

## 2017-02-25 DIAGNOSIS — C61 Malignant neoplasm of prostate: Secondary | ICD-10-CM | POA: Diagnosis not present

## 2017-02-26 ENCOUNTER — Ambulatory Visit
Admission: RE | Admit: 2017-02-26 | Discharge: 2017-02-26 | Disposition: A | Discharge status: Home / Self Care | Payer: Medicare Other | Source: Ambulatory Visit | Attending: Radiation Oncology | Admitting: Radiation Oncology

## 2017-02-26 DIAGNOSIS — C61 Malignant neoplasm of prostate: Secondary | ICD-10-CM | POA: Diagnosis not present

## 2017-02-27 ENCOUNTER — Ambulatory Visit
Admission: RE | Admit: 2017-02-27 | Discharge: 2017-02-27 | Disposition: A | Discharge status: Home / Self Care | Payer: Medicare Other | Source: Ambulatory Visit | Attending: Radiation Oncology | Admitting: Radiation Oncology

## 2017-02-27 DIAGNOSIS — C61 Malignant neoplasm of prostate: Secondary | ICD-10-CM | POA: Diagnosis not present

## 2017-02-28 ENCOUNTER — Ambulatory Visit
Admission: RE | Admit: 2017-02-28 | Discharge: 2017-02-28 | Disposition: A | Discharge status: Home / Self Care | Payer: Medicare Other | Source: Ambulatory Visit | Attending: Radiation Oncology | Admitting: Radiation Oncology

## 2017-02-28 DIAGNOSIS — C61 Malignant neoplasm of prostate: Secondary | ICD-10-CM | POA: Diagnosis not present

## 2017-03-03 ENCOUNTER — Ambulatory Visit
Admission: RE | Admit: 2017-03-03 | Discharge: 2017-03-03 | Disposition: A | Discharge status: Home / Self Care | Payer: Medicare Other | Source: Ambulatory Visit | Attending: Radiation Oncology | Admitting: Radiation Oncology

## 2017-03-03 DIAGNOSIS — C61 Malignant neoplasm of prostate: Secondary | ICD-10-CM | POA: Diagnosis not present

## 2017-03-04 ENCOUNTER — Ambulatory Visit
Admission: RE | Admit: 2017-03-04 | Discharge: 2017-03-04 | Disposition: A | Discharge status: Home / Self Care | Payer: Medicare Other | Source: Ambulatory Visit | Attending: Radiation Oncology | Admitting: Radiation Oncology

## 2017-03-04 DIAGNOSIS — C61 Malignant neoplasm of prostate: Secondary | ICD-10-CM | POA: Diagnosis not present

## 2017-03-05 ENCOUNTER — Ambulatory Visit
Admission: RE | Admit: 2017-03-05 | Discharge: 2017-03-05 | Disposition: A | Discharge status: Home / Self Care | Payer: Medicare Other | Source: Ambulatory Visit | Attending: Radiation Oncology | Admitting: Radiation Oncology

## 2017-03-05 DIAGNOSIS — C61 Malignant neoplasm of prostate: Secondary | ICD-10-CM | POA: Diagnosis not present

## 2017-03-06 ENCOUNTER — Ambulatory Visit
Admission: RE | Admit: 2017-03-06 | Discharge: 2017-03-06 | Disposition: A | Discharge status: Home / Self Care | Payer: Medicare Other | Source: Ambulatory Visit | Attending: Radiation Oncology | Admitting: Radiation Oncology

## 2017-03-06 DIAGNOSIS — C61 Malignant neoplasm of prostate: Secondary | ICD-10-CM | POA: Diagnosis not present

## 2017-03-07 ENCOUNTER — Ambulatory Visit
Admission: RE | Admit: 2017-03-07 | Discharge: 2017-03-07 | Disposition: A | Discharge status: Home / Self Care | Payer: Medicare Other | Source: Ambulatory Visit | Attending: Radiation Oncology | Admitting: Radiation Oncology

## 2017-03-07 DIAGNOSIS — C61 Malignant neoplasm of prostate: Secondary | ICD-10-CM | POA: Diagnosis not present

## 2017-03-10 ENCOUNTER — Ambulatory Visit
Admission: RE | Admit: 2017-03-10 | Discharge: 2017-03-10 | Disposition: A | Discharge status: Home / Self Care | Payer: Medicare Other | Source: Ambulatory Visit | Attending: Radiation Oncology | Admitting: Radiation Oncology

## 2017-03-10 DIAGNOSIS — C61 Malignant neoplasm of prostate: Secondary | ICD-10-CM | POA: Diagnosis not present

## 2017-03-11 ENCOUNTER — Ambulatory Visit
Admission: RE | Admit: 2017-03-11 | Discharge: 2017-03-11 | Disposition: A | Discharge status: Home / Self Care | Payer: Medicare Other | Source: Ambulatory Visit | Attending: Radiation Oncology | Admitting: Radiation Oncology

## 2017-03-11 ENCOUNTER — Inpatient Hospital Stay: Payer: Medicare Other | Attending: Radiation Oncology

## 2017-03-11 DIAGNOSIS — C61 Malignant neoplasm of prostate: Secondary | ICD-10-CM

## 2017-03-11 DIAGNOSIS — Z79818 Long term (current) use of other agents affecting estrogen receptors and estrogen levels: Secondary | ICD-10-CM | POA: Diagnosis not present

## 2017-03-11 LAB — CBC
HEMATOCRIT: 39.2 % — AB (ref 40.0–52.0)
Hemoglobin: 13.4 g/dL (ref 13.0–18.0)
MCH: 28.2 pg (ref 26.0–34.0)
MCHC: 34.1 g/dL (ref 32.0–36.0)
MCV: 82.7 fL (ref 80.0–100.0)
PLATELETS: 272 10*3/uL (ref 150–440)
RBC: 4.74 MIL/uL (ref 4.40–5.90)
RDW: 13.3 % (ref 11.5–14.5)
WBC: 6.2 10*3/uL (ref 3.8–10.6)

## 2017-03-12 ENCOUNTER — Ambulatory Visit
Admission: RE | Admit: 2017-03-12 | Discharge: 2017-03-12 | Disposition: A | Discharge status: Home / Self Care | Payer: Medicare Other | Source: Ambulatory Visit | Attending: Radiation Oncology | Admitting: Radiation Oncology

## 2017-03-12 DIAGNOSIS — C61 Malignant neoplasm of prostate: Secondary | ICD-10-CM | POA: Diagnosis not present

## 2017-03-13 ENCOUNTER — Ambulatory Visit
Admission: RE | Admit: 2017-03-13 | Discharge: 2017-03-13 | Disposition: A | Discharge status: Home / Self Care | Payer: Medicare Other | Source: Ambulatory Visit | Attending: Radiation Oncology | Admitting: Radiation Oncology

## 2017-03-13 DIAGNOSIS — C61 Malignant neoplasm of prostate: Secondary | ICD-10-CM | POA: Diagnosis not present

## 2017-03-14 ENCOUNTER — Ambulatory Visit
Admission: RE | Admit: 2017-03-14 | Discharge: 2017-03-14 | Disposition: A | Discharge status: Home / Self Care | Payer: Medicare Other | Source: Ambulatory Visit | Attending: Radiation Oncology | Admitting: Radiation Oncology

## 2017-03-14 DIAGNOSIS — C61 Malignant neoplasm of prostate: Secondary | ICD-10-CM | POA: Diagnosis not present

## 2017-03-17 ENCOUNTER — Ambulatory Visit
Admission: RE | Admit: 2017-03-17 | Discharge: 2017-03-17 | Disposition: A | Discharge status: Home / Self Care | Payer: Medicare Other | Source: Ambulatory Visit | Attending: Radiation Oncology | Admitting: Radiation Oncology

## 2017-03-17 DIAGNOSIS — C61 Malignant neoplasm of prostate: Secondary | ICD-10-CM | POA: Diagnosis not present

## 2017-03-18 ENCOUNTER — Ambulatory Visit
Admission: RE | Admit: 2017-03-18 | Discharge: 2017-03-18 | Disposition: A | Discharge status: Home / Self Care | Payer: Medicare Other | Source: Ambulatory Visit | Attending: Radiation Oncology | Admitting: Radiation Oncology

## 2017-03-18 DIAGNOSIS — C61 Malignant neoplasm of prostate: Secondary | ICD-10-CM | POA: Diagnosis not present

## 2017-03-19 ENCOUNTER — Ambulatory Visit
Admission: RE | Admit: 2017-03-19 | Discharge: 2017-03-19 | Disposition: A | Discharge status: Home / Self Care | Payer: Medicare Other | Source: Ambulatory Visit | Attending: Radiation Oncology | Admitting: Radiation Oncology

## 2017-03-19 DIAGNOSIS — C61 Malignant neoplasm of prostate: Secondary | ICD-10-CM | POA: Diagnosis not present

## 2017-03-20 ENCOUNTER — Ambulatory Visit
Admission: RE | Admit: 2017-03-20 | Discharge: 2017-03-20 | Disposition: A | Discharge status: Home / Self Care | Payer: Medicare Other | Source: Ambulatory Visit | Attending: Radiation Oncology | Admitting: Radiation Oncology

## 2017-03-20 DIAGNOSIS — C61 Malignant neoplasm of prostate: Secondary | ICD-10-CM | POA: Diagnosis not present

## 2017-03-21 ENCOUNTER — Ambulatory Visit
Admission: RE | Admit: 2017-03-21 | Discharge: 2017-03-21 | Disposition: A | Discharge status: Home / Self Care | Payer: Medicare Other | Source: Ambulatory Visit | Attending: Radiation Oncology | Admitting: Radiation Oncology

## 2017-03-21 DIAGNOSIS — C61 Malignant neoplasm of prostate: Secondary | ICD-10-CM | POA: Diagnosis not present

## 2017-03-24 ENCOUNTER — Ambulatory Visit
Admission: RE | Admit: 2017-03-24 | Discharge: 2017-03-24 | Disposition: A | Discharge status: Home / Self Care | Payer: Medicare Other | Source: Ambulatory Visit | Attending: Radiation Oncology | Admitting: Radiation Oncology

## 2017-03-24 DIAGNOSIS — C61 Malignant neoplasm of prostate: Secondary | ICD-10-CM | POA: Diagnosis not present

## 2017-03-25 ENCOUNTER — Ambulatory Visit
Admission: RE | Admit: 2017-03-25 | Discharge: 2017-03-25 | Disposition: A | Discharge status: Home / Self Care | Payer: Medicare Other | Source: Ambulatory Visit | Attending: Radiation Oncology | Admitting: Radiation Oncology

## 2017-03-25 ENCOUNTER — Inpatient Hospital Stay: Payer: Medicare Other

## 2017-03-25 DIAGNOSIS — C61 Malignant neoplasm of prostate: Secondary | ICD-10-CM

## 2017-03-25 LAB — CBC
HEMATOCRIT: 39 % — AB (ref 40.0–52.0)
HEMOGLOBIN: 13.3 g/dL (ref 13.0–18.0)
MCH: 28 pg (ref 26.0–34.0)
MCHC: 34.2 g/dL (ref 32.0–36.0)
MCV: 81.9 fL (ref 80.0–100.0)
PLATELETS: 239 10*3/uL (ref 150–440)
RBC: 4.76 MIL/uL (ref 4.40–5.90)
RDW: 13.8 % (ref 11.5–14.5)
WBC: 6.2 10*3/uL (ref 3.8–10.6)

## 2017-03-26 ENCOUNTER — Ambulatory Visit
Admission: RE | Admit: 2017-03-26 | Discharge: 2017-03-26 | Disposition: A | Discharge status: Home / Self Care | Payer: Medicare Other | Source: Ambulatory Visit | Attending: Radiation Oncology | Admitting: Radiation Oncology

## 2017-03-26 DIAGNOSIS — C61 Malignant neoplasm of prostate: Secondary | ICD-10-CM | POA: Diagnosis not present

## 2017-03-27 ENCOUNTER — Ambulatory Visit
Admission: RE | Admit: 2017-03-27 | Discharge: 2017-03-27 | Disposition: A | Discharge status: Home / Self Care | Payer: Medicare Other | Source: Ambulatory Visit | Attending: Radiation Oncology | Admitting: Radiation Oncology

## 2017-03-27 DIAGNOSIS — C61 Malignant neoplasm of prostate: Secondary | ICD-10-CM | POA: Diagnosis not present

## 2017-03-28 ENCOUNTER — Ambulatory Visit
Admission: RE | Admit: 2017-03-28 | Discharge: 2017-03-28 | Disposition: A | Discharge status: Home / Self Care | Payer: Medicare Other | Source: Ambulatory Visit | Attending: Radiation Oncology | Admitting: Radiation Oncology

## 2017-03-28 DIAGNOSIS — C61 Malignant neoplasm of prostate: Secondary | ICD-10-CM | POA: Diagnosis not present

## 2017-03-31 ENCOUNTER — Ambulatory Visit
Admission: RE | Admit: 2017-03-31 | Discharge: 2017-03-31 | Disposition: A | Discharge status: Home / Self Care | Payer: Medicare Other | Source: Ambulatory Visit | Attending: Radiation Oncology | Admitting: Radiation Oncology

## 2017-03-31 DIAGNOSIS — C61 Malignant neoplasm of prostate: Secondary | ICD-10-CM | POA: Diagnosis not present

## 2017-04-01 ENCOUNTER — Ambulatory Visit
Admission: RE | Admit: 2017-04-01 | Discharge: 2017-04-01 | Disposition: A | Discharge status: Home / Self Care | Payer: Medicare Other | Source: Ambulatory Visit | Attending: Radiation Oncology | Admitting: Radiation Oncology

## 2017-04-01 DIAGNOSIS — C61 Malignant neoplasm of prostate: Secondary | ICD-10-CM | POA: Diagnosis not present

## 2017-04-02 ENCOUNTER — Ambulatory Visit
Admission: RE | Admit: 2017-04-02 | Discharge: 2017-04-02 | Disposition: A | Discharge status: Home / Self Care | Payer: Medicare Other | Source: Ambulatory Visit | Attending: Radiation Oncology | Admitting: Radiation Oncology

## 2017-04-02 DIAGNOSIS — C61 Malignant neoplasm of prostate: Secondary | ICD-10-CM | POA: Diagnosis not present

## 2017-04-03 ENCOUNTER — Ambulatory Visit
Admission: RE | Admit: 2017-04-03 | Discharge: 2017-04-03 | Disposition: A | Discharge status: Home / Self Care | Payer: Medicare Other | Source: Ambulatory Visit | Attending: Radiation Oncology | Admitting: Radiation Oncology

## 2017-04-03 DIAGNOSIS — C61 Malignant neoplasm of prostate: Secondary | ICD-10-CM | POA: Diagnosis not present

## 2017-04-04 ENCOUNTER — Ambulatory Visit
Admission: RE | Admit: 2017-04-04 | Discharge: 2017-04-04 | Disposition: A | Discharge status: Home / Self Care | Payer: Medicare Other | Source: Ambulatory Visit | Attending: Radiation Oncology | Admitting: Radiation Oncology

## 2017-04-04 DIAGNOSIS — C61 Malignant neoplasm of prostate: Secondary | ICD-10-CM | POA: Diagnosis not present

## 2017-04-07 ENCOUNTER — Ambulatory Visit
Admission: RE | Admit: 2017-04-07 | Discharge: 2017-04-07 | Disposition: A | Discharge status: Home / Self Care | Payer: Medicare Other | Source: Ambulatory Visit | Attending: Radiation Oncology | Admitting: Radiation Oncology

## 2017-04-07 DIAGNOSIS — C61 Malignant neoplasm of prostate: Secondary | ICD-10-CM | POA: Diagnosis not present

## 2017-04-08 ENCOUNTER — Ambulatory Visit
Admission: RE | Admit: 2017-04-08 | Discharge: 2017-04-08 | Disposition: A | Discharge status: Home / Self Care | Payer: Medicare Other | Source: Ambulatory Visit | Attending: Radiation Oncology | Admitting: Radiation Oncology

## 2017-04-08 ENCOUNTER — Inpatient Hospital Stay: Payer: Medicare Other | Attending: Radiation Oncology

## 2017-04-08 DIAGNOSIS — C61 Malignant neoplasm of prostate: Secondary | ICD-10-CM | POA: Insufficient documentation

## 2017-04-08 LAB — CBC
HCT: 39.9 % — ABNORMAL LOW (ref 40.0–52.0)
Hemoglobin: 13.5 g/dL (ref 13.0–18.0)
MCH: 27.7 pg (ref 26.0–34.0)
MCHC: 33.9 g/dL (ref 32.0–36.0)
MCV: 81.6 fL (ref 80.0–100.0)
PLATELETS: 254 10*3/uL (ref 150–440)
RBC: 4.89 MIL/uL (ref 4.40–5.90)
RDW: 13.8 % (ref 11.5–14.5)
WBC: 5.3 10*3/uL (ref 3.8–10.6)

## 2017-04-09 ENCOUNTER — Ambulatory Visit
Admission: RE | Admit: 2017-04-09 | Discharge: 2017-04-09 | Disposition: A | Discharge status: Home / Self Care | Payer: Medicare Other | Source: Ambulatory Visit | Attending: Radiation Oncology | Admitting: Radiation Oncology

## 2017-04-09 DIAGNOSIS — C61 Malignant neoplasm of prostate: Secondary | ICD-10-CM | POA: Diagnosis not present

## 2017-04-10 ENCOUNTER — Ambulatory Visit
Admission: RE | Admit: 2017-04-10 | Discharge: 2017-04-10 | Disposition: A | Discharge status: Home / Self Care | Payer: Medicare Other | Source: Ambulatory Visit | Attending: Radiation Oncology | Admitting: Radiation Oncology

## 2017-04-10 DIAGNOSIS — C61 Malignant neoplasm of prostate: Secondary | ICD-10-CM | POA: Diagnosis not present

## 2017-04-11 ENCOUNTER — Ambulatory Visit
Admission: RE | Admit: 2017-04-11 | Discharge: 2017-04-11 | Disposition: A | Discharge status: Home / Self Care | Payer: Medicare Other | Source: Ambulatory Visit | Attending: Radiation Oncology | Admitting: Radiation Oncology

## 2017-04-11 DIAGNOSIS — C61 Malignant neoplasm of prostate: Secondary | ICD-10-CM | POA: Diagnosis not present

## 2017-04-14 ENCOUNTER — Ambulatory Visit
Admission: RE | Admit: 2017-04-14 | Discharge: 2017-04-14 | Disposition: A | Discharge status: Home / Self Care | Payer: Medicare Other | Source: Ambulatory Visit | Attending: Radiation Oncology | Admitting: Radiation Oncology

## 2017-04-14 DIAGNOSIS — C61 Malignant neoplasm of prostate: Secondary | ICD-10-CM | POA: Diagnosis not present

## 2017-04-15 ENCOUNTER — Ambulatory Visit
Admission: RE | Admit: 2017-04-15 | Discharge: 2017-04-15 | Disposition: A | Discharge status: Home / Self Care | Payer: Medicare Other | Source: Ambulatory Visit | Attending: Radiation Oncology | Admitting: Radiation Oncology

## 2017-04-15 DIAGNOSIS — C61 Malignant neoplasm of prostate: Secondary | ICD-10-CM | POA: Diagnosis not present

## 2017-04-16 ENCOUNTER — Ambulatory Visit
Admission: RE | Admit: 2017-04-16 | Discharge: 2017-04-16 | Disposition: A | Discharge status: Home / Self Care | Payer: Medicare Other | Source: Ambulatory Visit | Attending: Radiation Oncology | Admitting: Radiation Oncology

## 2017-04-16 DIAGNOSIS — C61 Malignant neoplasm of prostate: Secondary | ICD-10-CM | POA: Diagnosis not present

## 2017-04-18 ENCOUNTER — Other Ambulatory Visit: Payer: Self-pay | Admitting: *Deleted

## 2017-04-18 DIAGNOSIS — C61 Malignant neoplasm of prostate: Secondary | ICD-10-CM

## 2017-04-22 ENCOUNTER — Encounter: Payer: Self-pay | Admitting: Urology

## 2017-04-22 ENCOUNTER — Ambulatory Visit (INDEPENDENT_AMBULATORY_CARE_PROVIDER_SITE_OTHER): Payer: Medicare Other | Admitting: Urology

## 2017-04-22 VITALS — BP 127/66 | HR 81 | Ht 72.0 in | Wt 233.0 lb

## 2017-04-22 DIAGNOSIS — C61 Malignant neoplasm of prostate: Secondary | ICD-10-CM

## 2017-04-22 DIAGNOSIS — N393 Stress incontinence (female) (male): Secondary | ICD-10-CM

## 2017-04-22 DIAGNOSIS — N5231 Erectile dysfunction following radical prostatectomy: Secondary | ICD-10-CM | POA: Diagnosis not present

## 2017-04-22 MED ORDER — SILDENAFIL CITRATE 20 MG PO TABS: 20.0000 mg | ORAL_TABLET | ORAL | 11 refills | Status: DC | PRN

## 2017-04-22 NOTE — Progress Notes (Signed)
04/22/2017 1:30 PM   Lee Graham 03-14-1951 [id number]  Referring provider: Albina Billet, MD [address]   [place], [place] [address]  Chief Complaint  Patient presents with  . Prostate Cancer    30month    HPI: 66 year old male with high-risk prostate cancer status post robotic radical prostatectomy with bilateral pelvic lymph node dissection on 12/19/2016.  Surgical pathology consistent with Gleason 3+5, focal extraprostatic extension, right seminal vesicle involvement, and microscopic bladder neck invasion all present. Surgical margins negative. Negative lymph nodes.  pT2bN0 M0.    Postoperatively, his PSA was detectable at 0.2 ng/dL.  He since completed salvage radiation.  Overall, his continence is improving.  He had a minor setback at the beginning of radiation but once this completed, his continence and has continued to improve.  He is now down to 2 pads/diapers daily.  These are not always saturated depending on his activity level.  He is mostly dry at nighttime.  He is taken a short break from Kegel exercises.  He does have severe baseline erectile dysfunction, previously failed PDE 5 inhibitors.  He is interested in discussing intracavernosal injections today.  PMH: Past Medical History:  Diagnosis Date  . Arthritis   . Cancer ([employer]) 11/13/2016   prostate  . Deafness in left ear    ONLY 30% HEARING IN RIGHT EAR  . Hyperlipidemia   . Hypertension     Surgical History: Past Surgical History:  Procedure Laterality Date  . COLONOSCOPY WITH PROPOFOL N/A 08/30/2015   Procedure: COLONOSCOPY WITH PROPOFOL;  Surgeon: Christene Lye, MD;  Location: [facility] ENDOSCOPY;  Service: Endoscopy;  Laterality: N/A;  . FINGER SURGERY Right   . ROBOT ASSISTED LAPAROSCOPIC RADICAL PROSTATECTOMY N/A 12/09/2016   Procedure: ROBOTIC ASSISTED LAPAROSCOPIC RADICAL PROSTATECTOMY WITH BILATERAL PELVIC NODE DISSECTION;  Surgeon: Hollice Espy, MD;  Location: [facility] ORS;   Service: Urology;  Laterality: N/A;    Home Medications:  Allergies as of 04/22/2017   No Known Allergies     Medication List        Accurate as of 04/22/17 11:59 PM. Always use your most recent med list.          aspirin EC 81 MG tablet Take 81 mg by mouth daily.   metoprolol succinate 100 MG 24 hr tablet Commonly known as:  TOPROL-XL Take 50 mg by mouth daily. Takes 0.5 tablet in am.   sildenafil 20 MG tablet Commonly known as:  REVATIO Take 1 tablet (20 mg total) as needed by mouth. Take 1-5 tabs as needed prior to intercourse   simvastatin 40 MG tablet Commonly known as:  ZOCOR Take 40 mg by mouth daily at 6 PM.   telmisartan 40 MG tablet Commonly known as:  MICARDIS Take 20 mg by mouth daily. Pt takes 0.5 tablet in am.   triamterene-hydrochlorothiazide 37.5-25 MG capsule Commonly known as:  DYAZIDE Take 1 capsule by mouth daily.       Allergies: No Known Allergies  Family History: Family History  Problem Relation Age of Onset  . Colon cancer Father   . Prostate cancer Neg Hx   . Bladder Cancer Neg Hx   . Kidney cancer Neg Hx     Social History:  reports that he quit smoking about 46 years ago. His smoking use included cigars. He has quit using smokeless tobacco. He reports that he does not drink alcohol or use drugs.  ROS: UROLOGY Frequent Urination?: Yes Hard to postpone urination?: Yes Burning/pain  with urination?: No Get up at night to urinate?: Yes Leakage of urine?: Yes Urine stream starts and stops?: No Trouble starting stream?: No Do you have to strain to urinate?: No Blood in urine?: No Urinary tract infection?: No Sexually transmitted disease?: No Injury to kidneys or bladder?: No Painful intercourse?: No Weak stream?: No Erection problems?: No Penile pain?: No  Gastrointestinal Nausea?: No Vomiting?: No Indigestion/heartburn?: No Diarrhea?: No Constipation?: No  Constitutional Fever: No Night sweats?: No Weight loss?:  No Fatigue?: No  Skin Skin rash/lesions?: No Itching?: No  Eyes Blurred vision?: No Double vision?: No  Ears/Nose/Throat Sore throat?: No Sinus problems?: No  Hematologic/Lymphatic Swollen glands?: No Easy bruising?: No  Cardiovascular Leg swelling?: No Chest pain?: No  Respiratory Cough?: No Shortness of breath?: No  Endocrine Excessive thirst?: No  Musculoskeletal Back pain?: No Joint pain?: No  Neurological Headaches?: No Dizziness?: No  Psychologic Depression?: No Anxiety?: No  Physical Exam: BP 127/66   Pulse 81   Ht 6' (1.829 m)   Wt 233 lb (105.7 kg)   BMI 31.60 kg/m   Constitutional:  Alert and oriented, No acute distress.  Accompanied by wife today. HEENT: [place] AT, moist mucus membranes.  Trachea midline, no masses. Cardiovascular: No clubbing, cyanosis, or edema. Respiratory: Normal respiratory effort, no increased work of breathing. Skin: No rashes, bruises or suspicious lesions. Neurologic: Grossly intact, no focal deficits, moving all 4 extremities. Psychiatric: Normal affect.    Laboratory Data: Lab Results  Component Value Date   WBC 5.3 04/08/2017   HGB 13.5 04/08/2017   HCT 39.9 (L) 04/08/2017   MCV 81.6 04/08/2017   PLT 254 04/08/2017    Lab Results  Component Value Date   CREATININE 1.43 (H) 12/10/2016     Urinalysis N/a  Pertinent Imaging: n/a  Assessment & Plan:    1. Prostate cancer [facility]) S/p RALP with BPLND 11/2016 pT3b N0 M0 High risk features with detectable PSA at one month, 0.2 ng/dL S/p salvage radiation currently on Lupron per Dr. Baruch Gouty Defer PSA today, plan for repeat in 6 months  2. Stress incontinence, male Severe postoperative urinary incontinence but improving Continue pelvic floor therapy  3. Erectile dysfunction due to arterial insufficiency Severe preop and now postop erectile dysfunction Intracavernosal injections discussed today including testing dose, cost, risk and benefits He will  let us know if he would like to schedule injection teaching/test dose   Return in about 6 months (around 10/20/2017).  Hollice Espy, MD  [facility] [address] [place], [place] [address] [phone]

## 2017-04-28 ENCOUNTER — Other Ambulatory Visit: Payer: Self-pay | Admitting: *Deleted

## 2017-05-01 ENCOUNTER — Other Ambulatory Visit: Payer: Self-pay | Admitting: *Deleted

## 2017-05-28 ENCOUNTER — Ambulatory Visit
Admission: RE | Admit: 2017-05-28 | Discharge: 2017-05-28 | Disposition: A | Discharge status: Home / Self Care | Payer: Medicare Other | Source: Ambulatory Visit | Attending: Radiation Oncology | Admitting: Radiation Oncology

## 2017-05-28 ENCOUNTER — Other Ambulatory Visit: Payer: Self-pay

## 2017-05-28 ENCOUNTER — Encounter: Payer: Self-pay | Admitting: Radiation Oncology

## 2017-05-28 VITALS — BP 123/70 | HR 84 | Temp 98.0°F | Wt 237.8 lb

## 2017-05-28 DIAGNOSIS — C61 Malignant neoplasm of prostate: Secondary | ICD-10-CM | POA: Insufficient documentation

## 2017-05-28 DIAGNOSIS — R5383 Other fatigue: Secondary | ICD-10-CM | POA: Insufficient documentation

## 2017-05-28 DIAGNOSIS — R32 Unspecified urinary incontinence: Secondary | ICD-10-CM | POA: Diagnosis not present

## 2017-05-28 DIAGNOSIS — R351 Nocturia: Secondary | ICD-10-CM | POA: Insufficient documentation

## 2017-05-28 DIAGNOSIS — Z923 Personal history of irradiation: Secondary | ICD-10-CM | POA: Insufficient documentation

## 2017-05-28 DIAGNOSIS — R0602 Shortness of breath: Secondary | ICD-10-CM | POA: Diagnosis not present

## 2017-05-28 NOTE — Progress Notes (Signed)
Radiation Oncology Follow up Note  Name: Lee Graham   Date:   05/28/2017 MRN:  [id number] DOB: Oct 22, 1950    This 66 y.o. male presents to the clinic today for one-month follow-up status post salvage radiation therapy in patient with adenocarcinoma the prostate Gleason score of 9 (4+5) status post robotic prostatectomy.  REFERRING PROVIDER: Albina Billet, MD  HPI: Patient is a 67 year old male who presented in July 2018 with a PSA of 10.7. Biopsy of his prostate in 8 of 12 cores showed Gleason 9 (4+5) adenocarcinoma. He underwent robotic-assisted radical prostatectomy.. Pathology showed extracapsular extension and involvement of the right seminal vesicle. There is also microscopic bladder neck invasion. He is one month out from salvage radiation therapy and is doing fairly well. He does complain of some shortness of breath and quite fatigued. Still continues to have urinary incontinence which has improved somewhat. He still has nocturia 3-4.  COMPLICATIONS OF TREATMENT: none  FOLLOW UP COMPLIANCE: keeps appointments   PHYSICAL EXAM:  BP 123/70   Pulse 84   Temp 98 F (36.7 C)   Wt 237 lb 12.3 oz (107.9 kg)   BMI 32.25 kg/m  On rectal exam rectal sphincter tone is good prostatic fossa is clear without evidence of nodularity or mass. Well-developed well-nourished patient in NAD. HEENT reveals PERLA, EOMI, discs not visualized.  Oral cavity is clear. No oral mucosal lesions are identified. Neck is clear without evidence of cervical or supraclavicular adenopathy. Lungs are clear to A&P. Cardiac examination is essentially unremarkable with regular rate and rhythm without murmur rub or thrill. Abdomen is benign with no organomegaly or masses noted. Motor sensory and DTR levels are equal and symmetric in the upper and lower extremities. Cranial nerves II through XII are grossly intact. Proprioception is intact. No peripheral adenopathy or edema is identified. No motor or sensory levels  are noted. Crude visual fields are within normal range.  RADIOLOGY RESULTS: No current films for review  PLAN: At this time am concerned about his shortness of breath I've asked him to visit his PMD Dr. Hall Busing with those complaints. This may be a cardiac condition he does have a history of coronary artery disease. Please was overall recovery from radiation therapy O recheck his PSA in about 2-3 months and I've set up a follow-up at that time. I also like to recheck his last Lupron injection would like him to keep him suppressed for at least 18 months. Follow-up appointment made. Patient knows to call with any concerns.  I would like to take this opportunity to thank you for allowing me to participate in the care of your patient.Noreene Filbert, MD

## 2017-06-18 ENCOUNTER — Inpatient Hospital Stay: Payer: Medicare Other | Attending: Radiation Oncology

## 2017-06-18 DIAGNOSIS — C61 Malignant neoplasm of prostate: Secondary | ICD-10-CM | POA: Diagnosis not present

## 2017-06-18 DIAGNOSIS — Z5111 Encounter for antineoplastic chemotherapy: Secondary | ICD-10-CM | POA: Diagnosis present

## 2017-06-18 MED ORDER — LEUPROLIDE ACETATE (4 MONTH) 30 MG IM KIT
30.0000 mg | PACK | Freq: Once | INTRAMUSCULAR | Status: AC
  Administered 2017-06-18: 30 mg via INTRAMUSCULAR
  Filled 2017-06-18: qty 30

## 2017-08-28 ENCOUNTER — Other Ambulatory Visit: Payer: Self-pay | Admitting: *Deleted

## 2017-08-28 ENCOUNTER — Inpatient Hospital Stay: Payer: Medicare Other | Attending: Radiation Oncology

## 2017-08-28 DIAGNOSIS — C61 Malignant neoplasm of prostate: Secondary | ICD-10-CM | POA: Diagnosis present

## 2017-08-28 LAB — PSA: Prostatic Specific Antigen: <0.01 ng/mL (ref 0.00–4.00)

## 2017-09-04 ENCOUNTER — Encounter: Payer: Self-pay | Admitting: Radiation Oncology

## 2017-09-04 ENCOUNTER — Ambulatory Visit
Admission: RE | Admit: 2017-09-04 | Discharge: 2017-09-04 | Disposition: A | Discharge status: Home / Self Care | Payer: Medicare Other | Source: Ambulatory Visit | Attending: Radiation Oncology | Admitting: Radiation Oncology

## 2017-09-04 ENCOUNTER — Other Ambulatory Visit: Payer: Self-pay

## 2017-09-04 ENCOUNTER — Other Ambulatory Visit: Payer: Self-pay | Admitting: *Deleted

## 2017-09-04 VITALS — BP 154/91 | HR 84 | Temp 98.2°F | Resp 20 | Wt 225.4 lb

## 2017-09-04 DIAGNOSIS — N529 Male erectile dysfunction, unspecified: Secondary | ICD-10-CM | POA: Diagnosis not present

## 2017-09-04 DIAGNOSIS — Z923 Personal history of irradiation: Secondary | ICD-10-CM | POA: Insufficient documentation

## 2017-09-04 DIAGNOSIS — C61 Malignant neoplasm of prostate: Secondary | ICD-10-CM | POA: Diagnosis not present

## 2017-09-04 DIAGNOSIS — Z9079 Acquired absence of other genital organ(s): Secondary | ICD-10-CM | POA: Diagnosis not present

## 2017-09-04 NOTE — Progress Notes (Signed)
Radiation Oncology Follow up Note  Name: Lee Graham   Date:   09/04/2017 MRN:  [id number] DOB: 1951-05-09    This 67 y.o. male presents to the clinic today for four-month follow-up status post salvage radiation therapy inpatient with Gleason 9 (4+5) adenocarcinoma the prostate status post robotic prostatectomy.  REFERRING PROVIDER: Albina Billet, MD  HPI: patient is a 67 year old male now out 4 months having completed salvage radiation therapy for a Gleason 9 (4+5) adenocarcinoma the prostate. He was status post robotic-assisted prostatectomy with pathology showing extracapsular extension and involving the right seminal vesicle. There is also microscopic bladder neck invasion. He is seen today in routine follow-up is doing fairly well he does complain about erectile dysfunction I've referred him back to urology although he would like to hold on that. His most recent PSA is 0.01. He is currently on Lupron therapy and is scheduled for another injection in June. His urinary condoms continues to be slightly improved..  COMPLICATIONS OF TREATMENT: none  FOLLOW UP COMPLIANCE: keeps appointments   PHYSICAL EXAM:  BP (!) 154/91   Pulse 84   Temp 98.2 F (36.8 C)   Resp 20   Wt 225 lb 6.7 oz (102.3 kg)   BMI 30.57 kg/m  Well-developed well-nourished patient in NAD. HEENT reveals PERLA, EOMI, discs not visualized.  Oral cavity is clear. No oral mucosal lesions are identified. Neck is clear without evidence of cervical or supraclavicular adenopathy. Lungs are clear to A&P. Cardiac examination is essentially unremarkable with regular rate and rhythm without murmur rub or thrill. Abdomen is benign with no organomegaly or masses noted. Motor sensory and DTR levels are equal and symmetric in the upper and lower extremities. Cranial nerves II through XII are grossly intact. Proprioception is intact. No peripheral adenopathy or edema is identified. No motor or sensory levels are noted. Crude visual  fields are within normal range.  RADIOLOGY RESULTS: no current films for review  PLAN: at the present time patient is doing well. I again I've referred him back to urology for evaluation of erectile dysfunction although he would like to wait on that. He is under good biochemical control. I've asked to see him back in 6 months for follow-up with a PSA prior to that visit. Will have him have another Lupron injection in June and continue suppression for least in the year and a half. Patient wife both compress my treatment plan well.  I would like to take this opportunity to thank you for allowing me to participate in the care of your patient.Lee Filbert, MD

## 2017-09-24 ENCOUNTER — Ambulatory Visit (INDEPENDENT_AMBULATORY_CARE_PROVIDER_SITE_OTHER): Payer: Medicare Other | Admitting: Urology

## 2017-09-24 ENCOUNTER — Encounter: Payer: Self-pay | Admitting: Urology

## 2017-09-24 VITALS — BP 131/68 | HR 97 | Ht 74.0 in | Wt 229.0 lb

## 2017-09-24 DIAGNOSIS — N5231 Erectile dysfunction following radical prostatectomy: Secondary | ICD-10-CM | POA: Diagnosis not present

## 2017-09-24 NOTE — Progress Notes (Signed)
09/24/17  Patient presents today for instructions on Trimix injections and to demonstrate his proficiency with the injections.   The patient washed his hands.  He then was instructed to tap the syringe to allow air bubbles to float to the top of the syringe. He then depressed the plunger to allow the air to escape.   He then selected an injection site at the left lateral shaft his penis between 9 and 11:00 positions between the base and midportion of the penis. He was careful to avoid the 6 and 12:00 positions and any surface veins or arteries.   He then grabbed the head of the penis towards the left with light tension.  He then cleansed the area using an alcohol swab. He then took the syringe and injected at a 90 angle into the corpus cavernosum 0.2 mL of Trimix.  He then applied compression to the injection site.   He tolerated the procedure well. He stated he had good comfort level with the process of the injections.     With this injection, he achieved 40*% fully tumescent erection.    After about 10 minutes, the procedure was repeated with an additional 0.3 mL Trimix resulting in approximately  80 % erection.     A/P:   1.  Erectile dysfunction secondary to radical prostatectomy -   Adequate injection teaching today with partial erection with 0.5 mL of traditional trimix.  I advised him to go home and titrate the dose up to .06 mL on his next injection it is serially increase the dose up to 1 mL.   He will return by 3:00 today or 2-3 hours after his injection if his erection fails to resolve.  We just discussed the complications of priapism and consequences of this including corporal fibrosis, penile pain, and worsening erectile dysfunction.   Hollice Espy, MD   I spent 15 min with this patient of which greater than 50% was spent in counseling and coordination of care with the patient.

## 2017-09-29 ENCOUNTER — Telehealth: Payer: Self-pay

## 2017-09-29 NOTE — Telephone Encounter (Signed)
Spoke w/Betsy at [facility] script per Dr. Erlene Quan was Trimix 30/1/10  0.68ml-1ml titrate up prn 10 58ml prefilled syringes with 3 fiills

## 2017-10-21 ENCOUNTER — Ambulatory Visit: Payer: Medicare Other | Admitting: Urology

## 2017-10-24 ENCOUNTER — Encounter: Payer: Self-pay | Admitting: Urology

## 2017-10-24 ENCOUNTER — Ambulatory Visit (INDEPENDENT_AMBULATORY_CARE_PROVIDER_SITE_OTHER): Payer: Medicare Other | Admitting: Urology

## 2017-10-24 VITALS — BP 137/79 | HR 96 | Resp 16 | Ht 74.0 in | Wt 237.2 lb

## 2017-10-24 DIAGNOSIS — N5231 Erectile dysfunction following radical prostatectomy: Secondary | ICD-10-CM | POA: Diagnosis not present

## 2017-10-24 DIAGNOSIS — N393 Stress incontinence (female) (male): Secondary | ICD-10-CM

## 2017-10-24 DIAGNOSIS — C61 Malignant neoplasm of prostate: Secondary | ICD-10-CM

## 2017-10-24 NOTE — Progress Notes (Signed)
10/24/2017 4:42 PM   Lee Graham 07/08/1950 [id number]  Referring provider: Albina Billet, MD [address]   [place], [place] [address]  Chief Complaint  Patient presents with  . Erectile Dysfunction  . Prostate Cancer    HPI: 67 year old male with high-risk prostate cancer status post robotic radical prostatectomy with bilateral pelvic lymph node dissection on 12/19/2016.  Surgical pathology consistent with Gleason 3+5, focal extraprostatic extension, right seminal vesicle involvement, and microscopic bladder neck invasion all present. Surgical margins negative. Negative lymph nodes.  pT2bN0 M0.    Postoperatively, his PSA was detectable at 0.2 ng/dL.  He since completed salvage radiation.  He is on Lupron currently for 3 additional months managed by Dr. Baruch Gouty.  Most recent PSA <0.01 on 08/28/2017.  Minimal stress incontinence.  He does have severe baseline erectile dysfunction, previously failed PDE 5 inhibitors.  He is using trimix up to 1 mL and is pleased with the result.  No penile pain.  No issues with priapism.   PMH: Past Medical History:  Diagnosis Date  . Arthritis   . Cancer ([place]) 11/13/2016   prostate  . Deafness in left ear    ONLY 30% HEARING IN RIGHT EAR  . Hyperlipidemia   . Hypertension     Surgical History: Past Surgical History:  Procedure Laterality Date  . COLONOSCOPY WITH PROPOFOL N/A 08/30/2015   Procedure: COLONOSCOPY WITH PROPOFOL;  Surgeon: Christene Lye, MD;  Location: [facility] ENDOSCOPY;  Service: Endoscopy;  Laterality: N/A;  . FINGER SURGERY Right   . ROBOT ASSISTED LAPAROSCOPIC RADICAL PROSTATECTOMY N/A 12/09/2016   Procedure: ROBOTIC ASSISTED LAPAROSCOPIC RADICAL PROSTATECTOMY WITH BILATERAL PELVIC NODE DISSECTION;  Surgeon: Hollice Espy, MD;  Location: [facility] ORS;  Service: Urology;  Laterality: N/A;    Home Medications:  Allergies as of 10/24/2017   No Known Allergies     Medication List        Accurate  as of 10/24/17  4:42 PM. Always use your most recent med list.          aspirin EC 81 MG tablet Take 81 mg by mouth daily.   metoprolol succinate 100 MG 24 hr tablet Commonly known as:  TOPROL-XL Take 50 mg by mouth daily. Takes 0.5 tablet in am.   simvastatin 40 MG tablet Commonly known as:  ZOCOR Take 40 mg by mouth daily at 6 PM.   telmisartan 40 MG tablet Commonly known as:  MICARDIS Take 20 mg by mouth daily. Pt takes 0.5 tablet in am.       Allergies: No Known Allergies  Family History: Family History  Problem Relation Age of Onset  . Colon cancer Father   . Prostate cancer Neg Hx   . Bladder Cancer Neg Hx   . Kidney cancer Neg Hx     Social History:  reports that he quit smoking about 47 years ago. His smoking use included cigars. He has quit using smokeless tobacco. He reports that he does not drink alcohol or use drugs.  ROS: UROLOGY Frequent Urination?: Yes Hard to postpone urination?: No Burning/pain with urination?: No Get up at night to urinate?: Yes Leakage of urine?: Yes Urine stream starts and stops?: No Trouble starting stream?: No Do you have to strain to urinate?: No Blood in urine?: No Urinary tract infection?: No Sexually transmitted disease?: No Injury to kidneys or bladder?: No Painful intercourse?: No Weak stream?: No Erection problems?: No Penile pain?: No  Gastrointestinal Nausea?: No Vomiting?: No Indigestion/heartburn?:  No Diarrhea?: No Constipation?: No  Constitutional Fever: No Night sweats?: No Weight loss?: No Fatigue?: No  Skin Skin rash/lesions?: No Itching?: No  Eyes Blurred vision?: No Double vision?: No  Ears/Nose/Throat Sore throat?: No Sinus problems?: No  Hematologic/Lymphatic Swollen glands?: No Easy bruising?: No  Cardiovascular Leg swelling?: No Chest pain?: No  Respiratory Cough?: No Shortness of breath?: No  Endocrine Excessive thirst?: No  Musculoskeletal Back pain?: No Joint  pain?: Yes  Neurological Headaches?: No Dizziness?: No  Psychologic Depression?: No Anxiety?: No  Physical Exam: BP 137/79   Pulse 96   Resp 16   Ht 6\' 2"  (1.88 m)   Wt 237 lb 3.2 oz (107.6 kg)   SpO2 96%   BMI 30.45 kg/m   Constitutional:  Alert and oriented, No acute distress.  Accompanied by wife today. HEENT: [place] AT, moist mucus membranes.  Trachea midline, no masses. Cardiovascular: No clubbing, cyanosis, or edema. Respiratory: Normal respiratory effort, no increased work of breathing. Skin: No rashes, bruises or suspicious lesions. Neurologic: Grossly intact, no focal deficits, moving all 4 extremities. Psychiatric: Normal mood and affect.  Laboratory Data: Lab Results  Component Value Date   WBC 5.3 04/08/2017   HGB 13.5 04/08/2017   HCT 39.9 (L) 04/08/2017   MCV 81.6 04/08/2017   PLT 254 04/08/2017    Lab Results  Component Value Date   CREATININE 1.43 (H) 12/10/2016    Urinalysis PSA  Pertinent Imaging: n/a  Assessment & Plan:    1. Prostate cancer [facility]) Currently NED Due for Lupron x3 months to be given at [place] in June We will plan to recheck PSA in October as scheduled Follow-up in 6 months  2. Erectile dysfunction after radical prostatectomy Doing well with Trimix injections  3. Stress incontinence, male Minimal  Return in about 6 months (around 04/26/2018).  Hollice Espy, MD  [facility] [address], [place], [place] [address] [phone]

## 2017-11-06 ENCOUNTER — Inpatient Hospital Stay: Payer: Medicare Other | Attending: Radiation Oncology

## 2017-11-06 DIAGNOSIS — C61 Malignant neoplasm of prostate: Secondary | ICD-10-CM | POA: Diagnosis not present

## 2017-11-06 DIAGNOSIS — Z5111 Encounter for antineoplastic chemotherapy: Secondary | ICD-10-CM | POA: Insufficient documentation

## 2017-11-06 MED ORDER — LEUPROLIDE ACETATE (4 MONTH) 30 MG IM KIT
30.0000 mg | PACK | Freq: Once | INTRAMUSCULAR | Status: AC
  Administered 2017-11-06: 30 mg via INTRAMUSCULAR

## 2018-02-28 IMAGING — CT CT ABD-PELV W/ CM
1 of 3 series · 13 of 32 positions shown, 18 images · IV contrast (APPLIED)
Comparison: None.

CLINICAL DATA: Prostate cancer staging, Gleason 4 + 5 = 9.

EXAM:
CT ABDOMEN AND PELVIS WITH CONTRAST
TECHNIQUE: Multidetector CT imaging of the abdomen and pelvis was performed
using the standard protocol following bolus administration of
intravenous contrast.
CONTRAST:  100mL SIUQLV-H77 IOPAMIDOL (SIUQLV-H77) INJECTION 61%

[Series 2: axial st · axial · 0.83mm/px · z∈[-1061,-546]mm · 13 of 115 slices shown, 18 images]
[im 6/115  soft-tissue]
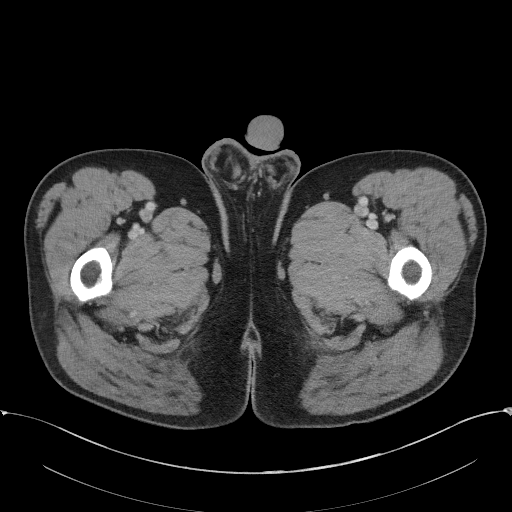
[im 6/115  bone]
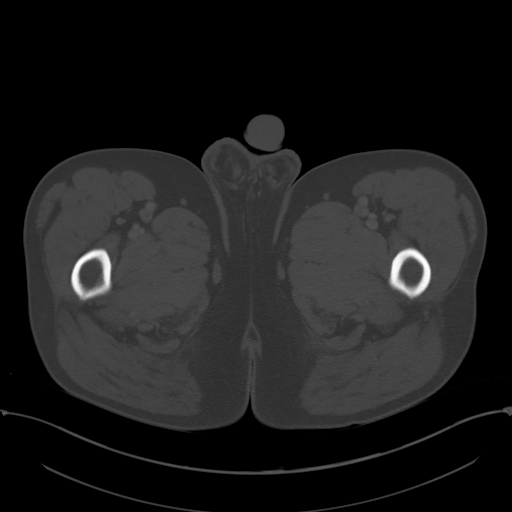
[im 18/115  soft-tissue]
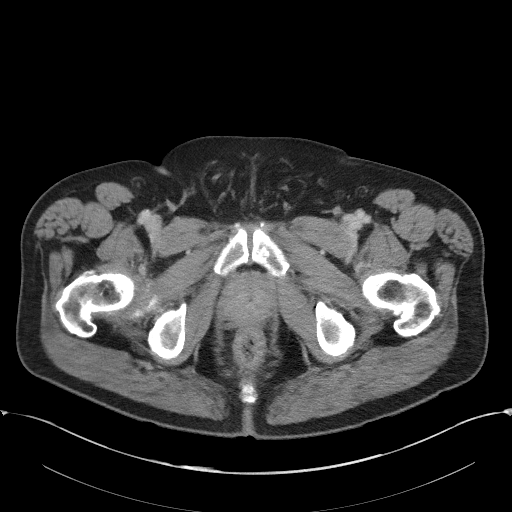
[im 23/115  soft-tissue]
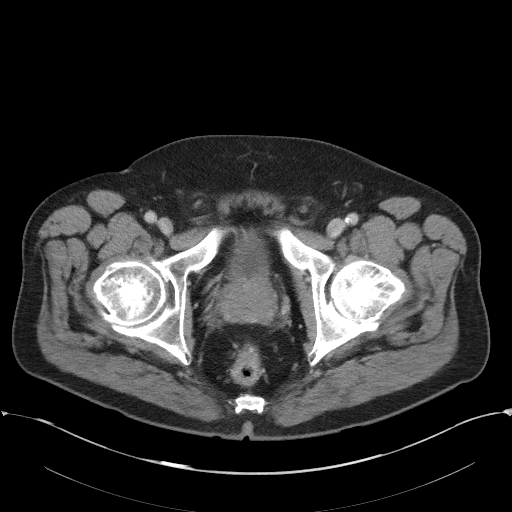
[im 35/115  soft-tissue]
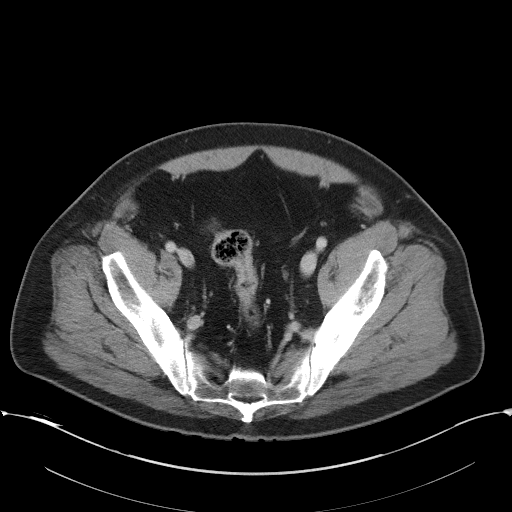
[im 46/115  soft-tissue]
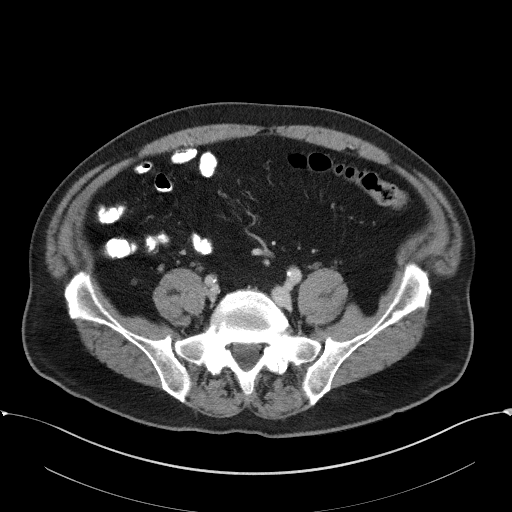
[im 52/115  soft-tissue]
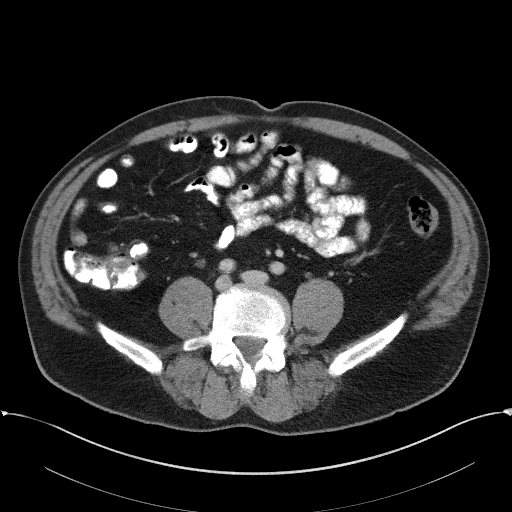
[im 63/115  soft-tissue]
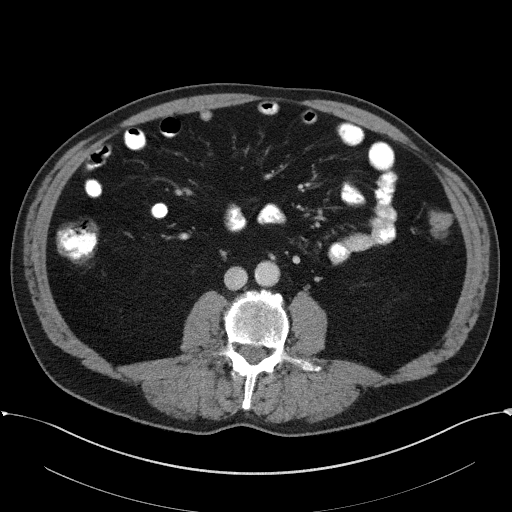
[im 69/115  soft-tissue]
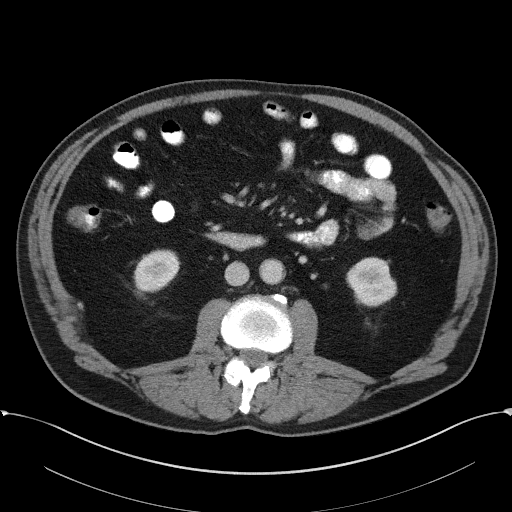
[im 80/115  soft-tissue]
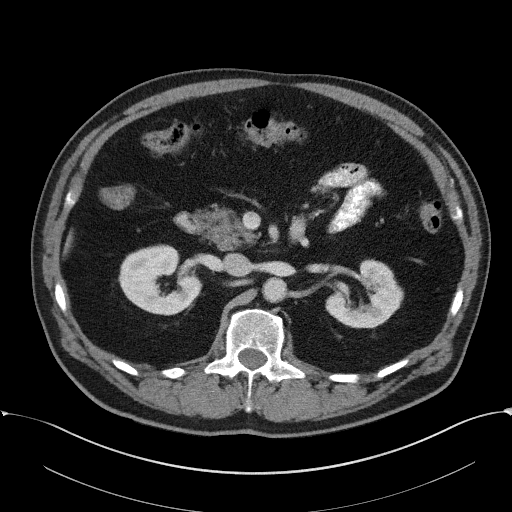
[im 80/115  bone]
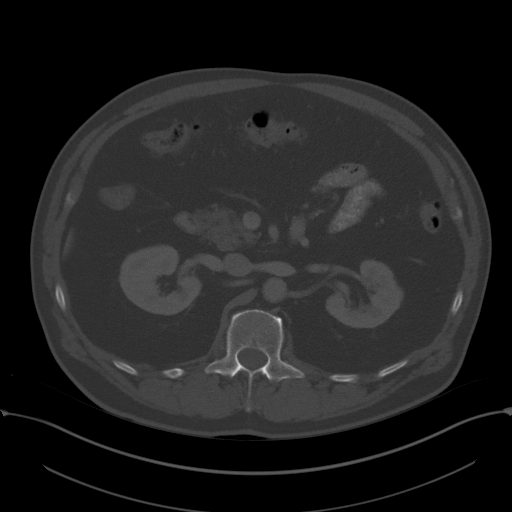
[im 92/115  soft-tissue]
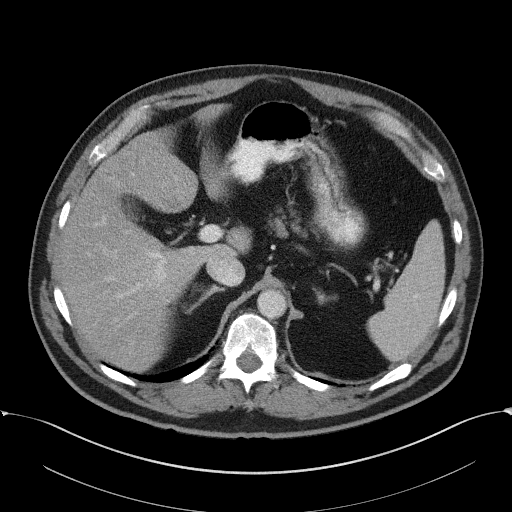
[im 92/115  lung]
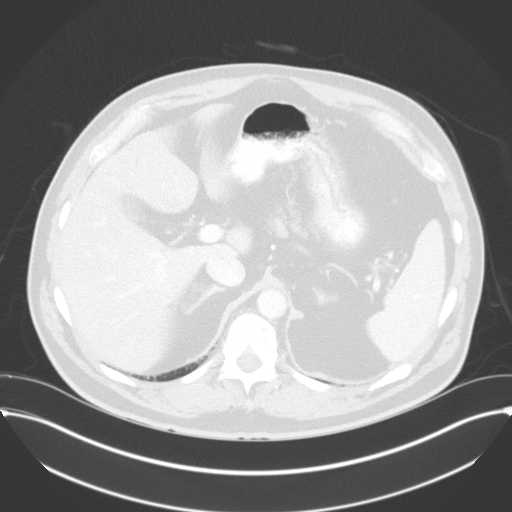
[im 97/115  soft-tissue]
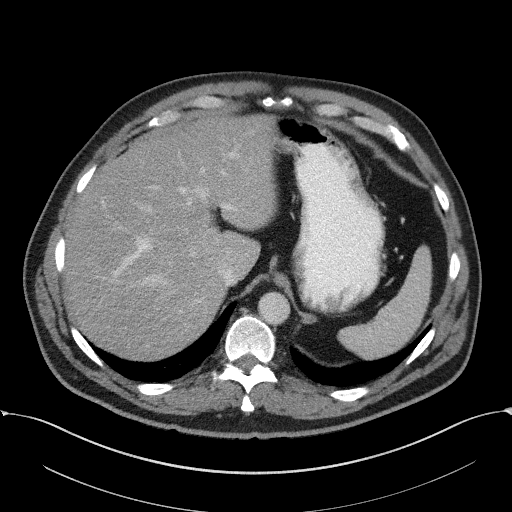
[im 97/115  lung]
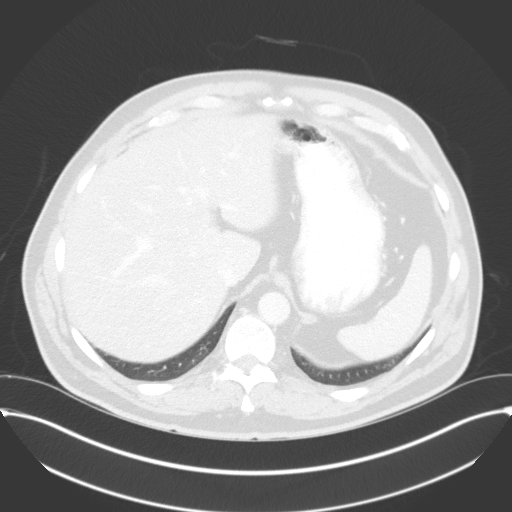
[im 103/115  lung]
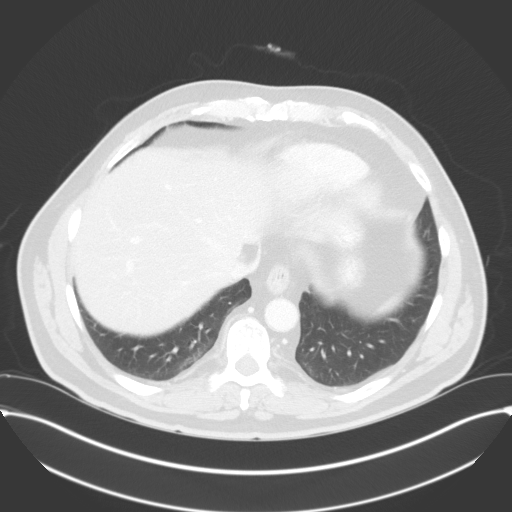
[im 109/115  soft-tissue]
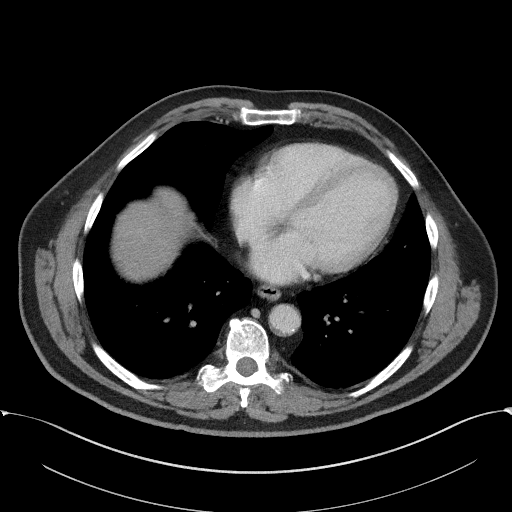
[im 109/115  lung]
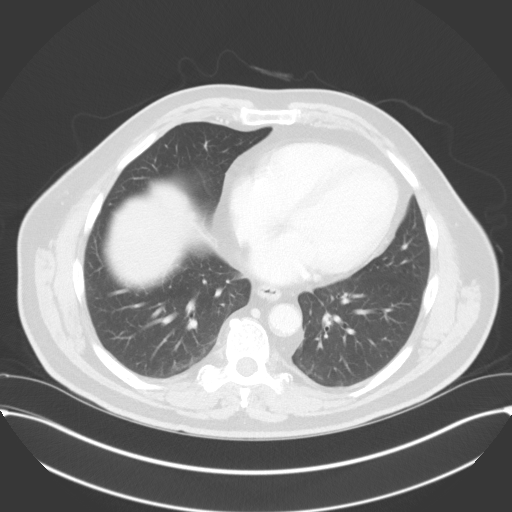

[13 of 32 positions shown; findings below may reference images not displayed]

FINDINGS: Lower chest: Unremarkable

Hepatobiliary: Diffuse hepatic steatosis.

Pancreas: Unremarkable

Spleen: Unremarkable

Adrenals/Urinary Tract: Adrenal glands normal. Several tiny
hypodense lesions of the right kidney are technically too small to
characterize although statistically likely to be cysts.

Stomach/Bowel: Unremarkable

Vascular/Lymphatic: Aortoiliac atherosclerotic vascular disease. No
pathologic adenopathy identified.

Reproductive: The prostate gland measures 6.0 by 4.8 by 5.7 cm
(volume = 86 cm^3). Seminal vesicles appear symmetric. Slight
prominence of venous structures in the upper scrotum bilaterally,
cannot exclude varicoceles.

Other: No supplemental non-categorized findings.

Musculoskeletal: Severe arthropathy of the right hip and moderate
arthropathy of the left hip, with prominent spurring and loss of
articular space, and some early flattening of the right femoral
head. Sclerosis with central lucency anteriorly in the right
acetabulum, likely from degenerative or erosive arthropathy.
Fragmented spurring of the right anterior acetabulum.

No compelling findings of osseous metastatic disease. Mild to
moderate left foraminal stenosis at L5-S1 due to facet spurring.
IMPRESSION: 1. No adenopathy or specific evidence of metastatic prostate cancer
in the abdomen or pelvis. Prostate volume estimated at 86 cubic cm.
2. Other imaging findings of potential clinical significance: Right
greater than left hip arthropathy. Lumbar spondylosis and
degenerative disc disease likely causing left foraminal impingement
at L5-S1. Aortic Atherosclerosis ([id number]). Diffuse hepatic
steatosis. Possible scrotal varicoceles bilaterally.

## 2018-03-03 ENCOUNTER — Ambulatory Visit (INDEPENDENT_AMBULATORY_CARE_PROVIDER_SITE_OTHER): Payer: Medicare Other | Admitting: General Surgery

## 2018-03-03 ENCOUNTER — Encounter: Payer: Self-pay | Admitting: General Surgery

## 2018-03-03 VITALS — BP 134/76 | HR 92 | Resp 20 | Ht 74.0 in | Wt 232.0 lb

## 2018-03-03 DIAGNOSIS — D1721 Benign lipomatous neoplasm of skin and subcutaneous tissue of right arm: Secondary | ICD-10-CM | POA: Diagnosis not present

## 2018-03-03 NOTE — Patient Instructions (Addendum)
Plan excision at [facility] outpatient   The patient is aware to call back for any questions or new concerns.  The patient is scheduled for surgery at [facility] on 03/13/18. He will pre admit by phone. The patient is aware of date and instructions.

## 2018-03-03 NOTE — Progress Notes (Signed)
Patient ID: Lee Graham, male   DOB: [birth year], 67 y.o.   MRN: [id number]  Chief Complaint  Patient presents with  . Mass    right shoulder    HPI Lee Graham is a 67 y.o. male here today for a evaluation of a lump on right shoulder, former patient of Dr Jamal Collin last seen in 2017. Patient noticed this area about 35 or more years ago. He states it is making his arm hurt since it has gotten larger. No drainage. He states there is a smaller one near the larger one that has been there for about a year.  She is her with his wife, [name].  HPI  Past Medical History:  Diagnosis Date  . Arthritis   . Cancer ([place]) 11/13/2016   prostate  . Deafness in left ear    ONLY 30% HEARING IN RIGHT EAR  . Hyperlipidemia   . Hypertension     Past Surgical History:  Procedure Laterality Date  . COLONOSCOPY WITH PROPOFOL N/A 08/30/2015   Procedure: COLONOSCOPY WITH PROPOFOL;  Surgeon: Christene Lye, MD;  Location: [facility] ENDOSCOPY;  Service: Endoscopy;  Laterality: N/A;  . FINGER SURGERY Right 2008   with skin graft   . ROBOT ASSISTED LAPAROSCOPIC RADICAL PROSTATECTOMY N/A 12/09/2016   Procedure: ROBOTIC ASSISTED LAPAROSCOPIC RADICAL PROSTATECTOMY WITH BILATERAL PELVIC NODE DISSECTION;  Surgeon: Hollice Espy, MD;  Location: [facility] ORS;  Service: Urology;  Laterality: N/A;    Family History  Problem Relation Age of Onset  . Colon cancer Father   . Prostate cancer Neg Hx   . Bladder Cancer Neg Hx   . Kidney cancer Neg Hx     Social History Social History   Tobacco Use  . Smoking status: Former Smoker    Types: Cigars    Last attempt to quit: 09/29/1970    Years since quitting: 47.4  . Smokeless tobacco: Former Network engineer Use Topics  . Alcohol use: No    Alcohol/week: 0.0 standard drinks  . Drug use: No    No Known Allergies  Current Outpatient Medications  Medication Sig Dispense Refill  . aspirin EC 81 MG tablet Take 81 mg by mouth daily.    . metoprolol  succinate (TOPROL-XL) 100 MG 24 hr tablet Take 50 mg by mouth daily. Takes 0.5 tablet in am.    . simvastatin (ZOCOR) 40 MG tablet Take 40 mg by mouth daily at 6 PM.     . telmisartan (MICARDIS) 40 MG tablet Take 20 mg by mouth daily. Pt takes 0.5 tablet in am.     No current facility-administered medications for this visit.     Review of Systems Review of Systems  Constitutional: Negative.   Respiratory: Negative.   Cardiovascular: Negative.     Blood pressure 134/76, pulse 92, resp. rate 20, height 6\' 2"  (1.88 m), weight 232 lb (105.2 kg), SpO2 96 %.  Physical Exam Physical Exam  Constitutional: He is oriented to person, place, and time. He appears well-developed and well-nourished.  HENT:  Mouth/Throat: Oropharynx is clear and moist.  Eyes: Conjunctivae are normal.  Neck: Neck supple.  Cardiovascular: Normal rate, regular rhythm and normal heart sounds.  Pulmonary/Chest: Effort normal and breath sounds normal.      Neurological: He is alert and oriented to person, place, and time.  Skin: Skin is warm and dry.  18 x 19 cm lipoma right posterior shoulder and left back at T 3 a 3 x 4 cm lipoma.  Psychiatric:  His behavior is normal.    Data Reviewed CBC of April 08, 2017 reviewed.  Hemoglobin 13.5.  Platelet count 254,000.  Normal white blood cell count.  Assessment    Large right posterior shoulder lipoma. setting.Small left mid back lipoma.    Plan    Plan excision at [facility] outpatient area although there does not appear to be direct muscle extension, considering the size and likely need for manipulation to extract this large lesion sedation at a minimum has been recommended.  The small lesion is yet to become symptomatic, but as he will be at the hospital and under sedation it has been recommended that this lesion be excised at the same setting.  HPI, Physical Exam, Assessment and Plan have been scribed under the direction and in the presence of Robert Bellow,  MD. Karie Fetch, RN  I have completed the exam and reviewed the above documentation for accuracy and completeness.  I agree with the above.  Haematologist has been used and any errors in dictation or transcription are unintentional.  Hervey Ard, M.D., F.A.C.S.  The patient is scheduled for surgery at [facility] on 03/13/18. He will pre admit by phone. The patient is aware of date and instructions. Documented by Caryl-Lyn Otis Brace LPN  [name] Hibba Schram 03/04/2018, 8:09 AM

## 2018-03-04 ENCOUNTER — Inpatient Hospital Stay: Payer: Medicare Other | Attending: Radiation Oncology

## 2018-03-04 DIAGNOSIS — C61 Malignant neoplasm of prostate: Secondary | ICD-10-CM | POA: Insufficient documentation

## 2018-03-04 DIAGNOSIS — D1721 Benign lipomatous neoplasm of skin and subcutaneous tissue of right arm: Secondary | ICD-10-CM | POA: Insufficient documentation

## 2018-03-04 LAB — PSA

## 2018-03-09 ENCOUNTER — Encounter
Admission: RE | Admit: 2018-03-09 | Discharge: 2018-03-09 | Disposition: A | Discharge status: Home / Self Care | Payer: Medicare Other | Source: Ambulatory Visit | Attending: General Surgery | Admitting: General Surgery

## 2018-03-09 ENCOUNTER — Other Ambulatory Visit: Payer: Self-pay

## 2018-03-09 HISTORY — DX: Gastro-esophageal reflux disease without esophagitis: K21.9

## 2018-03-09 NOTE — Pre-Procedure Instructions (Signed)
ECG 12-lead6/09/2017 [place] Component Name Value Ref Range  Vent Rate (bpm) 94   PR Interval (msec) 160   QRS Interval (msec) 86   QT Interval (msec) 354   QTc (msec) 442   Other Result Information  This result has an attachment that is not available.  Result Narrative  Normal sinus rhythm Voltage criteria for left ventricular hypertrophy  No previous ECGs available I reviewed and concur with this report. Electronically signed [id number] MD, KEN ([id number]) on 11/06/2017 1:27:37 PM  Status Results Details

## 2018-03-09 NOTE — Pre-Procedure Instructions (Signed)
NM myocardial perfusion SPECT multiple (stress and rest)11/14/2017 [place] Result Impression  Negative Lexiscan stress.LV function normal at 51%.No reversible  ischemia.Low risk study.  Other Result Information  This result has an attachment that is not available.  Result Narrative  CARDIOLOGY DEPARTMENT [facility] [address] [place], [id number] [phone]  Procedure: Pharmacologic Myocardial Perfusion Imaging ONE day procedure  Indication: Atypical chest pain Plan: NM myocardial perfusion SPECT multiple (stress  and rest), ECG stress test only  Ordering Physician:   Dr. Bartholome Bill   Clinical History: 67 y.o. year old male Vitals: Height: 74 in Weight: 231 lb Cardiac risk factors include:  Smoking, Diabetes, HTN and Obesity    Procedure:  Pharmacologic stress testing was performed with Regadenoson using a single  use 0.4mg /8ml (0.08 mg/ml) prefilled syringe intravenously infused as a  bolus dose. The stress test was stopped due to Infusion completion.Blood  pressure response was normal. The patient did not develop any symptoms  other than fatigue during the procedure.   Rest HR: 68 bpm Rest BP: 150 92 mmHg Max HR: 107bpm Min BP: 132 80 mmHg  Stress Test Administered by: Zenda Alpers CMA  ECG Interpretation: Rest WTU:UEKCMK sinus rhythm, none Stress LKJ:ZPHXTA sinus rhythm, no arrhythmia or ischemia  Recovery VWP:VXYIAX sinus rhythm ECG Interpretation:non-diagnostic due to pharmacologic testing.   Administrations This Visit  regadenoson (LEXISCAN) 0.4 mg/5 mL inj syringe 0.4 mg  Admin Date 11/14/2017 Action Given Dose 0.4 mg Route Intravenous Administered By Herbert Seta, CNMT     technetium Tc20m sestamibi (CARDIOLITE) injection 65.53 millicurie  Admin Date 74/82/7078 Action Given Dose 67.54 millicurie Route Intravenous Administered  By Herbert Seta, CNMT     technetium Tc4m sestamibi (CARDIOLITE) injection 49.20 millicurie  Admin Date 03/09/1218 Action Given Dose 75.88 millicurie Route Intravenous Administered By Herbert Seta, CNMT       Gated post-stress perfusion imaging was performed 30 minutes after stress.  Rest images were performed 30 minutes after injection.  Gated LV Analysis:  TID Ratio: 1.18  LVEF= 51%  FINDINGS: Regional wall motion:reveals normal myocardial thickening and wall  motion. The overall quality of the study is good. Artifacts noted: no Left ventricular cavity: normal.  Perfusion Analysis:SPECT images demonstrate homogeneous tracer  distribution throughout the myocardium.  Status Results Details   Encounter Summary

## 2018-03-09 NOTE — Patient Instructions (Signed)
Your procedure is scheduled on: 03-13-18  Report to Same Day Surgery 2nd floor medical mall [facility] Entrance-take elevator on left to 2nd floor.  Check in with surgery information desk.) To find out your arrival time please call [phone] between 1PM - 3PM on 03-12-18  Remember: Instructions that are not followed completely may result in serious medical risk, up to and including death, or upon the discretion of your surgeon and anesthesiologist your surgery may need to be rescheduled.    _x___ 1. Do not eat food after midnight the night before your procedure. You may drink clear liquids up to 2 hours before you are scheduled to arrive at the hospital for your procedure.  Do not drink clear liquids within 2 hours of your scheduled arrival to the hospital.  Clear liquids include  --Water or Apple juice without pulp  --Clear carbohydrate beverage such as ClearFast or Gatorade  --Black Coffee or Clear Tea (No milk, no creamers, do not add anything to the coffee or Tea   ____Ensure clear carbohydrate drink on the way to the hospital for bariatric patients  ____Ensure clear carbohydrate drink 3 hours before surgery for Dr Dwyane Luo patients if physician instructed.   No gum chewing or hard candies.     __x__ 2. No Alcohol for 24 hours before or after surgery.   __x__3. No Smoking or e-cigarettes for 24 prior to surgery.  Do not use any chewable tobacco products for at least 6 hour prior to surgery   ____  4. Bring all medications with you on the day of surgery if instructed.    __x__ 5. Notify your doctor if there is any change in your medical condition     (cold, fever, infections).    x___6. On the morning of surgery brush your teeth with toothpaste and water.  You may rinse your mouth with mouth wash if you wish.  Do not swallow any toothpaste or mouthwash.   Do not wear jewelry, make-up, hairpins, clips or nail polish.  Do not wear lotions, powders, or perfumes. You may wear  deodorant.  Do not shave 48 hours prior to surgery. Men may shave face and neck.  Do not bring valuables to the hospital.    [facility] is not responsible for any belongings or valuables.               Contacts, dentures or bridgework may not be worn into surgery.  Leave your suitcase in the car. After surgery it may be brought to your room.  For patients admitted to the hospital, discharge time is determined by your treatment team.  _  Patients discharged the day of surgery will not be allowed to drive home.  You will need someone to drive you home and stay with you the night of your procedure.    Please read over the following fact sheets that you were given:   [facility] Preparing for Surgery   _x___ TAKE THE FOLLOWING MEDICATION THE MORNING OF SURGERY WITH A SMALL SIP OF WATER. These include:  1. METOPROLOL  2.  3.  4.  5.  6.  ____Fleets enema or Magnesium Citrate as directed.   ____ Use CHG Soap or sage wipes as directed on instruction sheet   ____ Use inhalers on the day of surgery and bring to hospital day of surgery  ____ Stop Metformin and Janumet 2 days prior to surgery.    ____ Take 1/2 of usual insulin dose the night before  surgery and none on the morning surgery.   _x___ Follow recommendations from Cardiologist, Pulmonologist or PCP regarding stopping Aspirin, Coumadin, Plavix ,Eliquis, Effient, or Pradaxa, and Pletal-OK TO CONTINUE 81 MG ASA-DO NOT TAKE AM OF SURGERY  ____Stop Anti-inflammatories such as Advil, Aleve, Ibuprofen, Motrin, Naproxen, Naprosyn, Goodies powders or aspirin products. OK to take Tylenol    ____ Stop supplements until after surgery.   ____ Bring C-Pap to the hospital.

## 2018-03-10 ENCOUNTER — Ambulatory Visit: Payer: Managed Care, Other (non HMO) | Admitting: General Surgery

## 2018-03-11 ENCOUNTER — Encounter: Payer: Self-pay | Admitting: Radiation Oncology

## 2018-03-11 ENCOUNTER — Other Ambulatory Visit: Payer: Self-pay

## 2018-03-11 ENCOUNTER — Ambulatory Visit
Admission: RE | Admit: 2018-03-11 | Discharge: 2018-03-11 | Disposition: A | Discharge status: Home / Self Care | Payer: Medicare Other | Source: Ambulatory Visit | Attending: Radiation Oncology | Admitting: Radiation Oncology

## 2018-03-11 VITALS — BP 160/99 | HR 84 | Temp 97.8°F | Wt 234.5 lb

## 2018-03-11 DIAGNOSIS — C61 Malignant neoplasm of prostate: Secondary | ICD-10-CM

## 2018-03-11 DIAGNOSIS — Z923 Personal history of irradiation: Secondary | ICD-10-CM | POA: Insufficient documentation

## 2018-03-11 NOTE — Progress Notes (Signed)
Radiation Oncology Follow up Note  Name: Lee Graham   Date:   03/11/2018 MRN:  [id number] DOB: 07/26/1950    This 67 y.o. male presents to the clinic today for ten-month follow-up status post salvage radiation therapyfor a Gleason 8 (3+5)) status post robotic prostatectomy with focal extraprostatic extension as well as right seminal vesicle involvement and bladder neck invasion.  REFERRING PROVIDER: Albina Billet, MD  HPI: patient is a 67 year old male now out 10 months having completed salvage radiation therapy status post robotic prostatectomy in 2018. His pathology showed Gleason 8 (3+5) with focal extraprostatic extension seminal vesicle involvement as well as microscopic bladder neck invasion. His PSAwas detectable at 0.2 after she surgery and he underwent salvage radiation therapy he is seen today in routine follow-up and is doing well. He specifically denies diarrhea dysuria or any other GI/GU complaints. Has nocturia and some urgency. His most recent PSA was 0.01 this month..  COMPLICATIONS OF TREATMENT: none  FOLLOW UP COMPLIANCE: keeps appointments   PHYSICAL EXAM:  BP (!) 160/99 (BP Location: Left Arm, Patient Position: Sitting)   Pulse 84   Temp 97.8 F (36.6 C) (Tympanic)   Wt 234 lb 7.4 oz (106.4 kg)   BMI 30.10 kg/m  Well-developed well-nourished patient in NAD. HEENT reveals PERLA, EOMI, discs not visualized.  Oral cavity is clear. No oral mucosal lesions are identified. Neck is clear without evidence of cervical or supraclavicular adenopathy. Lungs are clear to A&P. Cardiac examination is essentially unremarkable with regular rate and rhythm without murmur rub or thrill. Abdomen is benign with no organomegaly or masses noted. Motor sensory and DTR levels are equal and symmetric in the upper and lower extremities. Cranial nerves II through XII are grossly intact. Proprioception is intact. No peripheral adenopathy or edema is identified. No motor or sensory levels are  noted. Crude visual fields are within normal range.  RADIOLOGY RESULTS: no current films for review  PLAN: present time patient is under excellent biochemical control of his prostate cancer and please was overall progress. He is been asked for a 3 month PSAs by Dr. Cherrie Gauze office although he does not seem to want to o that. I've asked to see him back in 1 year with a PSA prior to that visit. Patient is to call sooner with any concerns.  I would like to take this opportunity to thank you for allowing me to participate in the care of your patient.Noreene Filbert, MD

## 2018-03-13 ENCOUNTER — Ambulatory Visit: Payer: Medicare Other | Admitting: Registered Nurse

## 2018-03-13 ENCOUNTER — Encounter
Admission: RE | Disposition: A | Discharge status: Home / Self Care | Payer: Self-pay | Source: Ambulatory Visit | Attending: General Surgery

## 2018-03-13 ENCOUNTER — Ambulatory Visit
Admission: RE | Admit: 2018-03-13 | Discharge: 2018-03-13 | Disposition: A | Discharge status: Home / Self Care | Payer: Medicare Other | Source: Ambulatory Visit | Attending: General Surgery | Admitting: General Surgery

## 2018-03-13 DIAGNOSIS — D213 Benign neoplasm of connective and other soft tissue of thorax: Secondary | ICD-10-CM | POA: Diagnosis not present

## 2018-03-13 DIAGNOSIS — K219 Gastro-esophageal reflux disease without esophagitis: Secondary | ICD-10-CM | POA: Insufficient documentation

## 2018-03-13 DIAGNOSIS — E669 Obesity, unspecified: Secondary | ICD-10-CM | POA: Diagnosis not present

## 2018-03-13 DIAGNOSIS — Z7982 Long term (current) use of aspirin: Secondary | ICD-10-CM | POA: Diagnosis not present

## 2018-03-13 DIAGNOSIS — H918X1 Other specified hearing loss, right ear: Secondary | ICD-10-CM | POA: Insufficient documentation

## 2018-03-13 DIAGNOSIS — Z800 Family history of malignant neoplasm of digestive organs: Secondary | ICD-10-CM | POA: Diagnosis not present

## 2018-03-13 DIAGNOSIS — Z87891 Personal history of nicotine dependence: Secondary | ICD-10-CM | POA: Insufficient documentation

## 2018-03-13 DIAGNOSIS — Z79899 Other long term (current) drug therapy: Secondary | ICD-10-CM | POA: Diagnosis not present

## 2018-03-13 DIAGNOSIS — M1990 Unspecified osteoarthritis, unspecified site: Secondary | ICD-10-CM | POA: Diagnosis not present

## 2018-03-13 DIAGNOSIS — D1721 Benign lipomatous neoplasm of skin and subcutaneous tissue of right arm: Secondary | ICD-10-CM | POA: Insufficient documentation

## 2018-03-13 DIAGNOSIS — Z8546 Personal history of malignant neoplasm of prostate: Secondary | ICD-10-CM | POA: Insufficient documentation

## 2018-03-13 DIAGNOSIS — E785 Hyperlipidemia, unspecified: Secondary | ICD-10-CM | POA: Insufficient documentation

## 2018-03-13 DIAGNOSIS — D171 Benign lipomatous neoplasm of skin and subcutaneous tissue of trunk: Secondary | ICD-10-CM | POA: Insufficient documentation

## 2018-03-13 DIAGNOSIS — D2111 Benign neoplasm of connective and other soft tissue of right upper limb, including shoulder: Secondary | ICD-10-CM | POA: Diagnosis not present

## 2018-03-13 DIAGNOSIS — I10 Essential (primary) hypertension: Secondary | ICD-10-CM | POA: Insufficient documentation

## 2018-03-13 HISTORY — PX: LIPOMA EXCISION: SHX5283

## 2018-03-13 SURGERY — EXCISION LIPOMA
Anesthesia: General | Laterality: Bilateral

## 2018-03-13 MED ORDER — BUPIVACAINE-EPINEPHRINE (PF) 0.5% -1:200000 IJ SOLN
INTRAMUSCULAR | Status: DC | PRN
  Administered 2018-03-13: 30 mL

## 2018-03-13 MED ORDER — DEXAMETHASONE SODIUM PHOSPHATE 10 MG/ML IJ SOLN
INTRAMUSCULAR | Status: AC
  Filled 2018-03-13: qty 1

## 2018-03-13 MED ORDER — FAMOTIDINE 20 MG PO TABS
20.0000 mg | ORAL_TABLET | Freq: Once | ORAL | Status: AC
  Administered 2018-03-13: 20 mg via ORAL

## 2018-03-13 MED ORDER — HYDROCODONE-ACETAMINOPHEN 5-325 MG PO TABS: 1.0000 | ORAL_TABLET | ORAL | 0 refills | Status: DC | PRN

## 2018-03-13 MED ORDER — ROCURONIUM BROMIDE 100 MG/10ML IV SOLN
INTRAVENOUS | Status: DC | PRN
  Administered 2018-03-13: 10 mg via INTRAVENOUS

## 2018-03-13 MED ORDER — LACTATED RINGERS IV SOLN
INTRAVENOUS | Status: DC
  Administered 2018-03-13: 11:00:00 via INTRAVENOUS

## 2018-03-13 MED ORDER — PROPOFOL 10 MG/ML IV BOLUS
INTRAVENOUS | Status: AC
  Filled 2018-03-13: qty 20

## 2018-03-13 MED ORDER — MIDAZOLAM HCL 2 MG/2ML IJ SOLN
INTRAMUSCULAR | Status: AC
  Filled 2018-03-13: qty 2

## 2018-03-13 MED ORDER — MEPERIDINE HCL 50 MG/ML IJ SOLN: 6.2500 mg | INTRAMUSCULAR | Status: DC | PRN

## 2018-03-13 MED ORDER — LIDOCAINE HCL (PF) 2 % IJ SOLN
INTRAMUSCULAR | Status: AC
  Filled 2018-03-13: qty 10

## 2018-03-13 MED ORDER — FAMOTIDINE 20 MG PO TABS
ORAL_TABLET | ORAL | Status: AC
  Administered 2018-03-13: 20 mg via ORAL
  Filled 2018-03-13: qty 1

## 2018-03-13 MED ORDER — PHENYLEPHRINE HCL 10 MG/ML IJ SOLN
INTRAMUSCULAR | Status: DC | PRN
  Administered 2018-03-13 (×2): 100 ug via INTRAVENOUS
  Administered 2018-03-13 (×3): 200 ug via INTRAVENOUS

## 2018-03-13 MED ORDER — SUCCINYLCHOLINE CHLORIDE 20 MG/ML IJ SOLN
INTRAMUSCULAR | Status: DC | PRN
  Administered 2018-03-13: 100 mg via INTRAVENOUS

## 2018-03-13 MED ORDER — FENTANYL CITRATE (PF) 100 MCG/2ML IJ SOLN: 25.0000 ug | INTRAMUSCULAR | Status: DC | PRN

## 2018-03-13 MED ORDER — PHENYLEPHRINE HCL 10 MG/ML IJ SOLN
INTRAMUSCULAR | Status: AC
  Filled 2018-03-13: qty 1

## 2018-03-13 MED ORDER — ONDANSETRON HCL 4 MG/2ML IJ SOLN
INTRAMUSCULAR | Status: DC | PRN
  Administered 2018-03-13: 4 mg via INTRAVENOUS

## 2018-03-13 MED ORDER — ONDANSETRON HCL 4 MG/2ML IJ SOLN
INTRAMUSCULAR | Status: AC
  Filled 2018-03-13: qty 2

## 2018-03-13 MED ORDER — MIDAZOLAM HCL 2 MG/2ML IJ SOLN
INTRAMUSCULAR | Status: DC | PRN
  Administered 2018-03-13: 2 mg via INTRAVENOUS

## 2018-03-13 MED ORDER — EPHEDRINE SULFATE 50 MG/ML IJ SOLN
INTRAMUSCULAR | Status: DC | PRN
  Administered 2018-03-13: 10 mg via INTRAVENOUS

## 2018-03-13 MED ORDER — LIDOCAINE HCL (CARDIAC) PF 100 MG/5ML IV SOSY
PREFILLED_SYRINGE | INTRAVENOUS | Status: DC | PRN
  Administered 2018-03-13: 100 mg via INTRAVENOUS

## 2018-03-13 MED ORDER — GLYCOPYRROLATE 0.2 MG/ML IJ SOLN
INTRAMUSCULAR | Status: AC
  Filled 2018-03-13: qty 1

## 2018-03-13 MED ORDER — PROMETHAZINE HCL 25 MG/ML IJ SOLN: 6.2500 mg | INTRAMUSCULAR | Status: DC | PRN

## 2018-03-13 MED ORDER — GLYCOPYRROLATE 0.2 MG/ML IJ SOLN
INTRAMUSCULAR | Status: DC | PRN
  Administered 2018-03-13 (×2): 0.2 mg via INTRAVENOUS

## 2018-03-13 MED ORDER — SUCCINYLCHOLINE CHLORIDE 20 MG/ML IJ SOLN
INTRAMUSCULAR | Status: AC
  Filled 2018-03-13: qty 1

## 2018-03-13 MED ORDER — FENTANYL CITRATE (PF) 100 MCG/2ML IJ SOLN
INTRAMUSCULAR | Status: DC | PRN
  Administered 2018-03-13 (×2): 50 ug via INTRAVENOUS

## 2018-03-13 MED ORDER — PROPOFOL 10 MG/ML IV BOLUS
INTRAVENOUS | Status: DC | PRN
  Administered 2018-03-13: 200 mg via INTRAVENOUS

## 2018-03-13 MED ORDER — OXYCODONE HCL 5 MG/5ML PO SOLN: 5.0000 mg | Freq: Once | ORAL | Status: DC | PRN

## 2018-03-13 MED ORDER — OXYCODONE HCL 5 MG PO TABS: 5.0000 mg | ORAL_TABLET | Freq: Once | ORAL | Status: DC | PRN

## 2018-03-13 MED ORDER — ACETAMINOPHEN 10 MG/ML IV SOLN
INTRAVENOUS | Status: AC
  Filled 2018-03-13: qty 100

## 2018-03-13 MED ORDER — FENTANYL CITRATE (PF) 100 MCG/2ML IJ SOLN
INTRAMUSCULAR | Status: AC
  Filled 2018-03-13: qty 2

## 2018-03-13 MED ORDER — ACETAMINOPHEN 10 MG/ML IV SOLN
INTRAVENOUS | Status: DC | PRN
  Administered 2018-03-13: 1000 mg via INTRAVENOUS

## 2018-03-13 MED ORDER — DEXAMETHASONE SODIUM PHOSPHATE 10 MG/ML IJ SOLN
INTRAMUSCULAR | Status: DC | PRN
  Administered 2018-03-13: 10 mg via INTRAVENOUS

## 2018-03-13 SURGICAL SUPPLY — 38 items
CANISTER SUCT 1200ML W/VALVE (MISCELLANEOUS) ×3 IMPLANT
CHLORAPREP W/TINT 26ML (MISCELLANEOUS) ×3 IMPLANT
CLOSURE WOUND 1/2 X4 (GAUZE/BANDAGES/DRESSINGS) ×1
COVER WAND RF STERILE (DRAPES) ×3 IMPLANT
DRAPE LAPAROTOMY 100X77 ABD (DRAPES) ×3 IMPLANT
DRAPE SHEET LG 3/4 BI-LAMINATE (DRAPES) ×3 IMPLANT
DRSG OPSITE POSTOP 4X8 (GAUZE/BANDAGES/DRESSINGS) ×6 IMPLANT
DRSG TEGADERM 4X4.75 (GAUZE/BANDAGES/DRESSINGS) ×3 IMPLANT
DRSG TELFA 4X3 1S NADH ST (GAUZE/BANDAGES/DRESSINGS) ×3 IMPLANT
ELECT REM PT RETURN 9FT ADLT (ELECTROSURGICAL) ×3
ELECTRODE REM PT RTRN 9FT ADLT (ELECTROSURGICAL) ×1 IMPLANT
GAUZE SPONGE 4X4 12PLY STRL (GAUZE/BANDAGES/DRESSINGS) ×3 IMPLANT
GLOVE BIO SURGEON STRL SZ7.5 (GLOVE) ×3 IMPLANT
GLOVE INDICATOR 8.0 STRL GRN (GLOVE) ×3 IMPLANT
GOWN STRL REUS W/ TWL LRG LVL3 (GOWN DISPOSABLE) ×1 IMPLANT
GOWN STRL REUS W/ TWL XL LVL3 (GOWN DISPOSABLE) ×1 IMPLANT
GOWN STRL REUS W/TWL LRG LVL3 (GOWN DISPOSABLE) ×2
GOWN STRL REUS W/TWL XL LVL3 (GOWN DISPOSABLE) ×2
KIT TURNOVER KIT A (KITS) ×3 IMPLANT
LABEL OR SOLS (LABEL) ×3 IMPLANT
NS IRRIG 500ML POUR BTL (IV SOLUTION) ×3 IMPLANT
PACK BASIN MINOR ARMC (MISCELLANEOUS) ×3 IMPLANT
PAD PREP 24X41 OB/GYN DISP (PERSONAL CARE ITEMS) ×3 IMPLANT
SPONGE LAP 18X18 RF (DISPOSABLE) ×3 IMPLANT
STOCKINETTE STRL 6IN 960660 (GAUZE/BANDAGES/DRESSINGS) ×3 IMPLANT
STRIP CLOSURE SKIN 1/2X4 (GAUZE/BANDAGES/DRESSINGS) ×2 IMPLANT
SUT ETHILON 3-0 (SUTURE) IMPLANT
SUT ETHILON 3-0 FS-10 30 BLK (SUTURE) ×3
SUT ETHILON 4-0 (SUTURE) ×2
SUT ETHILON 4-0 FS2 18XMFL BLK (SUTURE) ×1
SUT VIC AB 2-0 CT1 (SUTURE) ×3 IMPLANT
SUT VIC AB 3-0 SH 27 (SUTURE) ×4
SUT VIC AB 3-0 SH 27X BRD (SUTURE) ×2 IMPLANT
SUT VIC AB 4-0 FS2 27 (SUTURE) ×3 IMPLANT
SUT VICRYL+ 3-0 144IN (SUTURE) ×3 IMPLANT
SUTURE EHLN 3-0 FS-10 30 BLK (SUTURE) ×1 IMPLANT
SUTURE ETHLN 4-0 FS2 18XMF BLK (SUTURE) ×1 IMPLANT
SWABSTK COMLB BENZOIN TINCTURE (MISCELLANEOUS) ×3 IMPLANT

## 2018-03-13 NOTE — Anesthesia Preprocedure Evaluation (Signed)
Anesthesia Evaluation  Patient identified by MRN, date of birth, ID band Patient awake    Reviewed: Allergy & Precautions, NPO status , Patient's Chart, lab work & pertinent test results  History of Anesthesia Complications Negative for: history of anesthetic complications  Airway Mallampati: III  TM Distance: >3 FB Neck ROM: Full    Dental  (+) Upper Dentures, Lower Dentures   Pulmonary neg sleep apnea, neg COPD, former smoker,    breath sounds clear to auscultation- rhonchi (-) wheezing      Cardiovascular Exercise Tolerance: Good hypertension, Pt. on medications (-) CAD, (-) Past MI, (-) Cardiac Stents and (-) CABG  Rhythm:Regular Rate:Normal - Systolic murmurs and - Diastolic murmurs    Neuro/Psych negative neurological ROS  negative psych ROS   GI/Hepatic Neg liver ROS, GERD  ,  Endo/Other  negative endocrine ROSneg diabetes  Renal/GU negative Renal ROS     Musculoskeletal  (+) Arthritis ,   Abdominal (+) + obese,   Peds  Hematology negative hematology ROS (+)   Anesthesia Other Findings Past Medical History: No date: Arthritis 11/13/2016: Cancer ([place])     Comment:  prostate No date: Deafness in left ear     Comment:  ONLY 30% HEARING IN RIGHT EAR No date: GERD (gastroesophageal reflux disease)     Comment:  H/O No date: Hyperlipidemia No date: Hypertension   Reproductive/Obstetrics                             Anesthesia Physical Anesthesia Plan  ASA: II  Anesthesia Plan: General   Post-op Pain Management:    Induction: Intravenous  PONV Risk Score and Plan: 1 and Ondansetron and Midazolam  Airway Management Planned: Oral ETT  Additional Equipment:   Intra-op Plan:   Post-operative Plan: Extubation in OR  Informed Consent: I have reviewed the patients History and Physical, chart, labs and discussed the procedure including the risks, benefits and alternatives  for the proposed anesthesia with the patient or authorized representative who has indicated his/her understanding and acceptance.   Dental advisory given  Plan Discussed with: CRNA and Anesthesiologist  Anesthesia Plan Comments:         Anesthesia Quick Evaluation

## 2018-03-13 NOTE — Anesthesia Postprocedure Evaluation (Signed)
Anesthesia Post Note  Patient: Lee Graham  Procedure(s) Performed: EXCISION LIPOMA- Right  SHOULDER AND RIGHT BACK (Bilateral )  Patient location during evaluation: PACU Anesthesia Type: General Level of consciousness: awake and alert and oriented Pain management: pain level controlled Vital Signs Assessment: post-procedure vital signs reviewed and stable Respiratory status: spontaneous breathing, nonlabored ventilation and respiratory function stable Cardiovascular status: blood pressure returned to baseline and stable Postop Assessment: no signs of nausea or vomiting Anesthetic complications: no     Last Vitals:  Vitals:   03/13/18 1325 03/13/18 1338  BP: 132/84 (!) 149/88  Pulse: (!) 103 (!) 101  Resp: 16 20  Temp: (!) 36.2 C (!) 36.2 C  SpO2: 94% 96%    Last Pain:  Vitals:   03/13/18 1338  TempSrc:   PainSc: 0-No pain                 Evangelia Whitaker

## 2018-03-13 NOTE — H&P (Signed)
No change in clinical history or exam.  For excision of right shoulder and left back lipomas.

## 2018-03-13 NOTE — Transfer of Care (Signed)
Immediate Anesthesia Transfer of Care Note  Patient: Lee Graham  Procedure(s) Performed: Procedure(s): EXCISION LIPOMA- LEFT SHOULDER AND RIGHT BACK (Bilateral)  Patient Location: PACU  Anesthesia Type:General  Level of Consciousness: sedated  Airway & Oxygen Therapy: Patient Spontanous Breathing and Patient connected to face mask oxygen  Post-op Assessment: Report given to RN and Post -op Vital signs reviewed and stable  Post vital signs: Reviewed and stable  Last Vitals:  Vitals:   03/13/18 1031 03/13/18 1245  BP: (!) 162/97 (!) 151/84  Pulse: 79 (!) 111  Resp: 16 15  Temp: (!) 36.4 C 36.6 C  SpO2: 80% 32%    Complications: No apparent anesthesia complications

## 2018-03-13 NOTE — Anesthesia Post-op Follow-up Note (Signed)
Anesthesia QCDR form completed.        

## 2018-03-13 NOTE — Anesthesia Procedure Notes (Signed)
Procedure Name: Intubation Date/Time: 03/13/2018 11:33 AM Performed by: Doreen Salvage, CRNA Pre-anesthesia Checklist: Patient identified, Patient being monitored, Timeout performed, Emergency Drugs available and Suction available Patient Re-evaluated:Patient Re-evaluated prior to induction Oxygen Delivery Method: Circle system utilized Preoxygenation: Pre-oxygenation with 100% oxygen Induction Type: IV induction Ventilation: Mask ventilation without difficulty Laryngoscope Size: Mac and 3 Grade View: Grade I Tube type: Oral Tube size: 7.5 mm Number of attempts: 1 Airway Equipment and Method: Stylet Placement Confirmation: ETT inserted through vocal cords under direct vision,  positive ETCO2 and breath sounds checked- equal and bilateral Secured at: 23 cm Tube secured with: Tape Dental Injury: Teeth and Oropharynx as per pre-operative assessment

## 2018-03-13 NOTE — OR Nursing (Signed)
Discharge instructions discussed with pt and wife. Both voice understanding. 

## 2018-03-13 NOTE — Discharge Instructions (Signed)

## 2018-03-13 NOTE — Op Note (Signed)
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  Postoperative diagnosis: Same.  Operative procedure: Excision of 12 x 14 cm right posterior shoulder lipoma.  Excision left mid back lipoma.  Operating Surgeon: Hervey Ard, MD.  Anesthesia: General endotracheal.  Marcaine 0.5% with 1 to 200,000 units of epinephrine, 30 cc.  Estimated blood loss: 30 cc.  Clinical note: This patient is developed a large mass on the right posterior shoulder.  He is felt to be a candidate for excision.  A smaller lesion on the left mid back was to be removed at the same time.  Operative note: The patient underwent general endotracheal anesthesia without difficulty.  He was then rolled into the left lateral decubitus position and supported on a beanbag and pillows.  The right arm was supported on pillows extended from the body to best exposed to the right posterior shoulder lipoma.  The area was cleansed with ChloraPrep and draped.  Field block anesthesia was placed for postoperative analgesia.  The larger lesion was approached through a 7-8 cm skin line incision.  This was made with the scalpel and the remaining dissection completed with electrocautery.  The lipoma was fairly free from adhesions to the anterior surface of the adipose tissue and only moderately adherent to the underlying muscle fascia.  Hemostasis was with electrocautery and 3-0 Vicryl ties and suture ligatures.  This was then packed with a lap sponge while attention was turned to the smaller lesion to the left of the midline.  This was approached through a transverse incision and a 2-1/2 cm soft tissue mass consistent with a lipoma removed.  This wound was closed with interrupted 2-0 Vicryl sutures to obliterate dead space and then a running 4-0 Vicryl subcuticular suture for the skin.  Attention was turned back to the original lipoma site.  Excellent hemostasis was noted.  A deep layer of running 2-0  Vicryl suture to obliterate the dead space by tacking the adipose tissue to the muscle fascia was completed.  A running 4-0 Vicryl subcuticular suture was then placed.  Benzoin, Steri-Strips followed by a honeycomb dressings were applied.  Patient tolerated the procedure well and was taken to recovery room in stable condition.

## 2018-03-14 ENCOUNTER — Encounter: Payer: Self-pay | Admitting: General Surgery

## 2018-03-16 LAB — SURGICAL PATHOLOGY

## 2018-03-18 ENCOUNTER — Telehealth: Payer: Self-pay

## 2018-03-18 NOTE — Telephone Encounter (Signed)
Notified patient as instructed, patient pleased. Discussed follow-up appointments, patient agrees  

## 2018-03-18 NOTE — Telephone Encounter (Signed)
-----   Message from Robert Bellow, MD sent at 03/18/2018  2:42 PM EDT ----- Please notify the patient pathology was fine: Just fatty tissue.  ----- Message ----- From: Interface, Lab In Three Zero One Sent: 03/16/2018  12:29 PM EDT To: Robert Bellow, MD

## 2018-03-19 ENCOUNTER — Other Ambulatory Visit: Payer: Self-pay

## 2018-03-19 ENCOUNTER — Ambulatory Visit (INDEPENDENT_AMBULATORY_CARE_PROVIDER_SITE_OTHER): Payer: Medicare Other | Admitting: General Surgery

## 2018-03-19 ENCOUNTER — Encounter: Payer: Self-pay | Admitting: General Surgery

## 2018-03-19 VITALS — BP 158/91 | HR 96 | Temp 97.9°F | Resp 16 | Ht 74.0 in | Wt 233.0 lb

## 2018-03-19 DIAGNOSIS — D1721 Benign lipomatous neoplasm of skin and subcutaneous tissue of right arm: Secondary | ICD-10-CM

## 2018-03-19 NOTE — Progress Notes (Signed)
Patient ID: Lee Graham, male   DOB: 04-24-1951, 67 y.o.   MRN: [id number]  Chief Complaint  Patient presents with  . Follow-up    HPI Lee Graham is a 67 y.o. male.  Here for postoperative visit, excision right shoulder lipoma on 03-13-18. He states he is doing well. He has noticed swelling of his right hand. He states his right arm hurt for 3 days after surgery and at this time he noted swelling in the area below the elbow extending down into the hand.  This has since resolved.  He is here with his wife, [name].  HPI  Past Medical History:  Diagnosis Date  . Arthritis   . Cancer ([place]) 11/13/2016   prostate  . Deafness in left ear    ONLY 30% HEARING IN RIGHT EAR  . GERD (gastroesophageal reflux disease)    H/O  . Hyperlipidemia   . Hypertension     Past Surgical History:  Procedure Laterality Date  . COLONOSCOPY WITH PROPOFOL N/A 08/30/2015   Procedure: COLONOSCOPY WITH PROPOFOL;  Surgeon: Christene Lye, MD;  Location: [facility] END[facility];  Service: Endoscopy;  Laterality: N/A;  . FINGER SURGERY Right 2008   with skin graft   . LIPOMA EXCISION Bilateral 03/13/2018   Procedure: EXCISION LIPOMA- Right  SHOULDER AND RIGHT BACK;  Surgeon: Robert Bellow, MD;  Location: [facility] ORS;  Service: General;  Laterality: Bilateral;  . ROBOT ASSISTED LAPAROSCOPIC RADICAL PROSTATECTOMY N/A 12/09/2016   Procedure: ROBOTIC ASSISTED LAPAROSCOPIC RADICAL PROSTATECTOMY WITH BILATERAL PELVIC NODE DISSECTION;  Surgeon: Hollice Espy, MD;  Location: [facility] ORS;  Service: Urology;  Laterality: N/A;    Family History  Problem Relation Age of Onset  . Colon cancer Father   . Prostate cancer Neg Hx   . Bladder Cancer Neg Hx   . Kidney cancer Neg Hx     Social History Social History   Tobacco Use  . Smoking status: Former Smoker    Types: Cigars    Last attempt to quit: 09/29/1970    Years since quitting: 47.5  . Smokeless tobacco: Former Network engineer Use Topics  .  Alcohol use: No    Alcohol/week: 0.0 standard drinks  . Drug use: No    No Known Allergies  Current Outpatient Medications  Medication Sig Dispense Refill  . aspirin EC 81 MG tablet Take 81 mg by mouth daily.    . metoprolol succinate (TOPROL-XL) 100 MG 24 hr tablet Take 50 mg by mouth every morning.     . simvastatin (ZOCOR) 40 MG tablet Take 40 mg by mouth daily at 6 PM.     . telmisartan (MICARDIS) 40 MG tablet Take 20 mg by mouth every morning.      No current facility-administered medications for this visit.     Review of Systems Review of Systems  Constitutional: Negative.   Respiratory: Negative.   Cardiovascular: Negative.     Blood pressure (!) 158/91, pulse 96, temperature 97.9 F (36.6 C), temperature source Skin, resp. rate 16, height 6\' 2"  (1.88 m), weight 233 lb (105.7 kg), SpO2 98 %.  Physical Exam Physical Exam  Constitutional: He is oriented to person, place, and time. He appears well-developed and well-nourished.  Pulmonary/Chest:      Musculoskeletal:  Excellent upper arm range of motion. Right hand edema.  Neurological: He is alert and oriented to person, place, and time.  Skin: Skin is warm and dry.  Psychiatric: His behavior is normal.    Data  Reviewed DIAGNOSIS:  A. LIPOMA, RIGHT POSTERIOR SHOULDER, SHORT IS LATERAL LONG STITCH  INFERIOR:  - LIPOMA SHOWING AREAS OF ORGANIZING FAT NECROSIS AND DYSTROPHIC  CALCIFICATIONS.   B. LIPOMA, LEFT BACK:  - LIPOMA.   Assessment    Doing well post lipoma excision.  Unexplained arm discomfort, resolved.    Plan    The patient had been positioned on a beanbag, left side down in the right arm supported on multiple pillows and appropriately taped to minimize sudden motion.  The area of dissection is well away from any nerves extending down into the upper arm, and at present the patient's symptoms have resolved.  Follow up as needed. The patient is aware to call back for any questions or new  concerns.       HPI, Physical Exam, Assessment and Plan have been scribed under the direction and in the presence of Robert Bellow, MD. Karie Fetch, RN  I have completed the exam and reviewed the above documentation for accuracy and completeness.  I agree with the above.  Haematologist has been used and any errors in dictation or transcription are unintentional.  Hervey Ard, M.D., F.A.C.S.  [name] [name] 03/22/2018, 4:26 PM

## 2018-03-19 NOTE — Patient Instructions (Signed)
The patient is aware to call back for any questions or concerns.  

## 2018-03-31 ENCOUNTER — Telehealth: Payer: Self-pay | Admitting: *Deleted

## 2018-03-31 NOTE — Telephone Encounter (Signed)
Wife called to let us know that over the weekend the smaller incision was itching and when he scratched the area he noticed small amount ( 1 tsp) clear to pink drainage. She covered it with band aid and today looked at the incision. There was a small opening and not as much drainage. She states the area is not red, denies fever or chills.  Offered nurse visit, declined for now. Signs of infection reviewed. She will apply neosporin and a Band-Aid and continue to monitor the area and call if she notices changes.

## 2018-04-28 ENCOUNTER — Encounter: Payer: Self-pay | Admitting: Urology

## 2018-04-28 ENCOUNTER — Ambulatory Visit (INDEPENDENT_AMBULATORY_CARE_PROVIDER_SITE_OTHER): Payer: Medicare Other | Admitting: Urology

## 2018-04-28 ENCOUNTER — Other Ambulatory Visit: Payer: Self-pay

## 2018-04-28 VITALS — BP 137/83 | HR 90 | Ht 74.0 in | Wt 234.0 lb

## 2018-04-28 DIAGNOSIS — C61 Malignant neoplasm of prostate: Secondary | ICD-10-CM

## 2018-04-28 DIAGNOSIS — N393 Stress incontinence (female) (male): Secondary | ICD-10-CM

## 2018-04-28 DIAGNOSIS — N5231 Erectile dysfunction following radical prostatectomy: Secondary | ICD-10-CM | POA: Diagnosis not present

## 2018-04-28 NOTE — Progress Notes (Signed)
04/28/2018 12:20 PM   Lee Graham Aug 16, 1950 [id number]  Referring provider: Albina Billet, MD [address]   [place], [place] [address]  Chief Complaint  Patient presents with  . Erectile Dysfunction  . Prostate Cancer    HPI: 67 year old male with a personal history of high risk prostate cancer status post robotic prostatectomy with bilateral lymph node dissection 12/2016 he returns today for routine six-month follow-up.  He has multiple complaints today and seeking medical advice on nonneurologic issues including sleep issues, right leg numbness and pain both of which are chronic.  He was advised to follow-up with his PCP for this.  Surgical pathology consistent with Gleason 3+5, focal extraprostatic extension, right seminal vesicle involvement, and microscopic bladder neck invasion all present. Surgical margins negative. Negative lymph nodes. pT2bN0 M0.   Postoperatively, his PSA was detectable at 0.2 ng/dL. He since completed salvage radiation.    He was on a short course of Lupron which was well-tolerated and has had no subsequent therapy.  He continues to use Trimix, 1 mL as needed which works well.  No issues with this including no penile pain or priapism.  He has minimal stress incontinence only with heavy activity or copious amounts of fluids.  He is not particularly bothered by this.   PMH: Past Medical History:  Diagnosis Date  . Arthritis   . Cancer ([place]) 11/13/2016   prostate  . Deafness in left ear    ONLY 30% HEARING IN RIGHT EAR  . GERD (gastroesophageal reflux disease)    H/O  . Hyperlipidemia   . Hypertension     Surgical History: Past Surgical History:  Procedure Laterality Date  . COLONOSCOPY WITH PROPOFOL N/A 08/30/2015   Procedure: COLONOSCOPY WITH PROPOFOL;  Surgeon: Christene Lye, MD;  Location: [facility];  Service: Endoscopy;  Laterality: N/A;  . FINGER SURGERY Right 2008   with skin graft   . LIPOMA EXCISION  Bilateral 03/13/2018   Procedure: EXCISION LIPOMA- Right  SHOULDER AND RIGHT BACK;  Surgeon: Robert Bellow, MD;  Location: [facility] ORS;  Service: General;  Laterality: Bilateral;  . ROBOT ASSISTED LAPAROSCOPIC RADICAL PROSTATECTOMY N/A 12/09/2016   Procedure: ROBOTIC ASSISTED LAPAROSCOPIC RADICAL PROSTATECTOMY WITH BILATERAL PELVIC NODE DISSECTION;  Surgeon: Hollice Espy, MD;  Location: [facility] ORS;  Service: Urology;  Laterality: N/A;    Home Medications:  Allergies as of 04/28/2018   No Known Allergies     Medication List        Accurate as of 04/28/18 12:20 PM. Always use your most recent med list.          aspirin EC 81 MG tablet Take 81 mg by mouth daily.   metoprolol succinate 100 MG 24 hr tablet Commonly known as:  TOPROL-XL Take 50 mg by mouth every morning.   simvastatin 40 MG tablet Commonly known as:  ZOCOR Take 40 mg by mouth daily at 6 PM.   telmisartan 40 MG tablet Commonly known as:  MICARDIS Take 20 mg by mouth every morning.       Allergies: No Known Allergies  Family History: Family History  Problem Relation Age of Onset  . Colon cancer Father   . Prostate cancer Neg Hx   . Bladder Cancer Neg Hx   . Kidney cancer Neg Hx     Social History:  reports that he quit smoking about 47 years ago. His smoking use included cigars. He has quit using smokeless tobacco. He reports that he does  not drink alcohol or use drugs.  ROS: UROLOGY Frequent Urination?: No Hard to postpone urination?: No Burning/pain with urination?: No Get up at night to urinate?: No Leakage of urine?: No Urine stream starts and stops?: No Trouble starting stream?: No Do you have to strain to urinate?: No Blood in urine?: No Urinary tract infection?: No Sexually transmitted disease?: No Injury to kidneys or bladder?: No Painful intercourse?: No Weak stream?: No Erection problems?: No Penile pain?: No  Gastrointestinal Nausea?: No Vomiting?: No Indigestion/heartburn?:  No Diarrhea?: No Constipation?: No  Constitutional Fever: No Night sweats?: No Weight loss?: No Fatigue?: No  Skin Skin rash/lesions?: No Itching?: No  Eyes Blurred vision?: No Double vision?: No  Ears/Nose/Throat Sore throat?: No Sinus problems?: No  Hematologic/Lymphatic Swollen glands?: No Easy bruising?: No  Cardiovascular Leg swelling?: No Chest pain?: No  Respiratory Cough?: No Shortness of breath?: No  Endocrine Excessive thirst?: No  Musculoskeletal Back pain?: No Joint pain?: No  Neurological Headaches?: No Dizziness?: No  Psychologic Depression?: No Anxiety?: No  Physical Exam: BP 137/83   Pulse 90   Ht 6\' 2"  (1.88 m)   Wt 234 lb (106.1 kg)   BMI 30.04 kg/m   Constitutional:  Alert and oriented, No acute distress.  Accompanied by wife today.  Loud.  Somewhat tangential. HEENT: [place] AT, moist mucus membranes.  Trachea midline, no masses. Cardiovascular: No clubbing, cyanosis, or edema. Respiratory: Normal respiratory effort, no increased work of breathing. Skin: No rashes, bruises or suspicious lesions. Neurologic: Grossly intact, no focal deficits, moving all 4 extremities. Psychiatric: Normal mood and affect.  Laboratory Data: Lab Results  Component Value Date   WBC 5.3 04/08/2017   HGB 13.5 04/08/2017   HCT 39.9 (L) 04/08/2017   MCV 81.6 04/08/2017   PLT 254 04/08/2017    Lab Results  Component Value Date   CREATININE 1.43 (H) 12/10/2016   Component     Latest Ref Rng & Units 08/28/2017 03/04/2018  Prostatic Specific Antigen     0.00 - 4.00 ng/mL <0.01 <0.01     Assessment & Plan:    1. Prostate cancer ([place]) Remains NED, PSA undetectable Personal history of high risk prostate cancer status post prostatectomy with adjuvant ADT/Lupron No further ADT at this time We will continue PSA a every 6 month interval - PSA; Future  2. Stress incontinence, male Minimal bother Continue to encourage pelvic floor exercises and  behavioral modification  3. 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  Hollice Espy, MD  [place] [address] [place], [place] [address] [phone]

## 2018-05-28 ENCOUNTER — Other Ambulatory Visit: Payer: Self-pay | Admitting: Urology

## 2018-10-29 ENCOUNTER — Other Ambulatory Visit: Payer: Self-pay

## 2018-10-29 ENCOUNTER — Other Ambulatory Visit: Payer: Medicare Other

## 2018-10-29 ENCOUNTER — Other Ambulatory Visit: Payer: Self-pay | Admitting: Urology

## 2018-10-29 DIAGNOSIS — C61 Malignant neoplasm of prostate: Secondary | ICD-10-CM

## 2018-10-29 NOTE — Telephone Encounter (Signed)
Pt needs refill for Trimex

## 2018-10-30 LAB — PSA: Prostate Specific Ag, Serum: <0.1 ng/mL (ref 0.0–4.0)

## 2018-11-02 ENCOUNTER — Telehealth: Payer: Self-pay | Admitting: Family Medicine

## 2018-11-02 NOTE — Telephone Encounter (Signed)
-----   Message from Hollice Espy, MD sent at 10/30/2018  7:59 AM EDT ----- PSA undetectable.  Hollice Espy, MD

## 2018-11-02 NOTE — Telephone Encounter (Signed)
Patient noticed and expressed understanding

## 2018-11-02 NOTE — Telephone Encounter (Signed)
Unable to reach patient.

## 2018-11-09 ENCOUNTER — Telehealth: Payer: Self-pay | Admitting: Urology

## 2018-11-09 NOTE — Telephone Encounter (Signed)
Pt said there were drops of blood in urine when he used bathroom.  There was more at night that was dingy, but not blood red.  It has happened twice since then.  There is no pain with urination.  When he's walking away from bathroom, there is a little pain in the head of the penis.  He said he feels like he's going to have an ejaculation, but there's nothing there to do that anymore.  Pt denies, chills, fever, vomiting, nausea.  Please advise.

## 2018-11-09 NOTE — Telephone Encounter (Signed)
Patient notified and appointment scheduled. 

## 2018-11-09 NOTE — Telephone Encounter (Signed)
I would recommend that he be seen for UA/urine culture and possible cystoscopy.  Given that he no longer has a prostate, its unrelated to ejaculation.  Hollice Espy, MD

## 2018-11-10 ENCOUNTER — Ambulatory Visit (INDEPENDENT_AMBULATORY_CARE_PROVIDER_SITE_OTHER): Payer: Medicare Other | Admitting: Urology

## 2018-11-10 ENCOUNTER — Other Ambulatory Visit: Payer: Self-pay

## 2018-11-10 ENCOUNTER — Encounter: Payer: Self-pay | Admitting: Urology

## 2018-11-10 VITALS — BP 168/83 | HR 81

## 2018-11-10 DIAGNOSIS — C61 Malignant neoplasm of prostate: Secondary | ICD-10-CM | POA: Diagnosis not present

## 2018-11-10 DIAGNOSIS — R310 Gross hematuria: Secondary | ICD-10-CM

## 2018-11-10 LAB — URINALYSIS, COMPLETE
Bilirubin, UA: NEGATIVE
Glucose, UA: NEGATIVE
Ketones, UA: NEGATIVE
Nitrite, UA: NEGATIVE
Protein,UA: NEGATIVE
Specific Gravity, UA: 1.015 (ref 1.005–1.030)
Urobilinogen, Ur: 0.2 mg/dL (ref 0.2–1.0)
pH, UA: 7 (ref 5.0–7.5)

## 2018-11-10 LAB — MICROSCOPIC EXAMINATION
Bacteria, UA: NONE SEEN
Epithelial Cells (non renal): NONE SEEN /hpf (ref 0–10)

## 2018-11-10 MED ORDER — SULFAMETHOXAZOLE-TRIMETHOPRIM 800-160 MG PO TABS: 1.0000 | ORAL_TABLET | Freq: Two times a day (BID) | ORAL | 0 refills | Status: DC

## 2018-11-10 NOTE — Progress Notes (Signed)
urin

## 2018-11-10 NOTE — Progress Notes (Signed)
11/10/2018 4:45 PM   Lee Graham May 23, 1951 [id number]  Referring provider: Albina Billet, MD [address]   [place],  [place] [address]  Chief Complaint  Patient presents with  . Hematuria    HPI: 68 year old male with a personal history of prostate cancer status post prostatectomy who presents today for further evaluation of gross hematuria and penile pain.  He called the office yesterday complaining of a few drops of blood in his urine as well as a little pain out of his penis.  He reports feeling as though he needed to ejaculate but nothing's coming out.  He denies any nausea vomiting fevers or chills.  I am unable to obtain additional history today as the patient is refusing to speak.  He says that he is not able to hear [place] although he is following commands.  He was offered interpreter but refused.  History primarily comes from his phone call yesterday.  He is upset that he is unable to have his wife accompany him to his visit today due to COVID-19 visitor restriction policy.   PMH: Past Medical History:  Diagnosis Date  . Arthritis   . Cancer ([place]) 11/13/2016   prostate  . Deafness in left ear    ONLY 30% HEARING IN RIGHT EAR  . GERD (gastroesophageal reflux disease)    H/O  . Hyperlipidemia   . Hypertension     Surgical History: Past Surgical History:  Procedure Laterality Date  . COLONOSCOPY WITH PROPOFOL N/A 08/30/2015   Procedure: COLONOSCOPY WITH PROPOFOL;  Surgeon: Christene Lye, MD;  Location: [facility] ENDOSCOPY;  Service: Endoscopy;  Laterality: N/A;  . FINGER SURGERY Right 2008   with skin graft   . LIPOMA EXCISION Bilateral 03/13/2018   Procedure: EXCISION LIPOMA- Right  SHOULDER AND RIGHT BACK;  Surgeon: Robert Bellow, MD;  Location: [facility] ORS;  Service: General;  Laterality: Bilateral;  . ROBOT ASSISTED LAPAROSCOPIC RADICAL PROSTATECTOMY N/A 12/09/2016   Procedure: ROBOTIC ASSISTED LAPAROSCOPIC RADICAL PROSTATECTOMY WITH BILATERAL  PELVIC NODE DISSECTION;  Surgeon: Hollice Espy, MD;  Location: [facility] ORS;  Service: Urology;  Laterality: N/A;    Home Medications:  Allergies as of 11/10/2018   No Known Allergies     Medication List       Accurate as of November 10, 2018 11:59 PM. If you have any questions, ask your nurse or doctor.        aspirin EC 81 MG tablet Take 81 mg by mouth daily.   metoprolol succinate 100 MG 24 hr tablet Commonly known as: TOPROL-XL Take 50 mg by mouth every morning.   sildenafil 20 MG tablet Commonly known as: REVATIO TAKE ONE TO FIVE TABLETS BY MOUTH DAILY AS NEEDED FOR ACTIVITY   simvastatin 40 MG tablet Commonly known as: ZOCOR Take 40 mg by mouth daily at 6 PM.   sulfamethoxazole-trimethoprim 800-160 MG tablet Commonly known as: BACTRIM DS Take 1 tablet by mouth every 12 (twelve) hours. Started by: Hollice Espy, MD   telmisartan 40 MG tablet Commonly known as: MICARDIS Take 20 mg by mouth every morning.       Allergies: No Known Allergies  Family History: Family History  Problem Relation Age of Onset  . Colon cancer Father   . Prostate cancer Neg Hx   . Bladder Cancer Neg Hx   . Kidney cancer Neg Hx     Social History:  reports that he quit smoking about 48 years ago. His smoking use included cigars.  He has quit using smokeless tobacco. He reports that he does not drink alcohol or use drugs.  ROS: Refused review of systems  Physical Exam: BP (!) 168/83   Pulse 81   Constitutional:  Alert and oriented, No acute distress. HEENT: [place] AT, moist mucus membranes.  Trachea midline, no masses. Cardiovascular: No clubbing, cyanosis, or edema. Respiratory: Normal respiratory effort, no increased work of breathing. Skin: No rashes, bruises or suspicious lesions. Neurologic: Grossly intact, no focal deficits, moving all 4 extremities.  Following commands such as putting on mask when instructed do so. Psychiatric: Irritated  Laboratory Data: Lab Results   Component Value Date   WBC 5.3 04/08/2017   HGB 13.5 04/08/2017   HCT 39.9 (L) 04/08/2017   MCV 81.6 04/08/2017   PLT 254 04/08/2017    Lab Results  Component Value Date   CREATININE 1.43 (H) 12/10/2016    Urinalysis    Component Value Date/Time   COLORURINE YELLOW (A) 11/29/2016 1011   APPEARANCEUR Hazy (A) 11/10/2018 1311   LABSPEC 1.008 11/29/2016 1011   PHURINE 7.0 11/29/2016 1011   GLUCOSEU Negative 11/10/2018 1311   HGBUR MODERATE (A) 11/29/2016 1011   BILIRUBINUR Negative 11/10/2018 1311   KETONESUR NEGATIVE 11/29/2016 1011   PROTEINUR Negative 11/10/2018 1311   PROTEINUR NEGATIVE 11/29/2016 1011   NITRITE Negative 11/10/2018 1311   NITRITE NEGATIVE 11/29/2016 1011   LEUKOCYTESUR 2+ (A) 11/10/2018 1311    Lab Results  Component Value Date   LABMICR See below: 11/10/2018   WBCUA 11-30 (A) 11/10/2018   LABEPIT None seen 11/10/2018   BACTERIA None seen 11/10/2018     Assessment & Plan:    1. Prostate cancer ([place]) Most recent PSA undetectable status post prostatectomy  2. Gross hematuria/ penile pain Urinalysis today concerning for possible underlying UTI We will go and treat him with Bactrim DS twice daily for 7 days Follow-up urine culture Lab on return next month to recheck his urine, may consider cystoscopy if he has persistent symptoms and/or hematuria Given that the patient is refusing to speak and also states that he can hear me, I typed out all of my recommendations which is given to him in printed form - Urinalysis, Complete - Urine culture  Follow-up in 1 month with repeat UA/possible cystoscopy  Hollice Espy, MD  [facility] [address] [place], [place] [address] [phone]

## 2018-11-10 NOTE — Patient Instructions (Signed)
I understand he passed some small blood clots and had some tenderness at the tip of your penis with sensation like you are going to ejaculate.  Reviewed your urinalysis today, it looks like you likely have a urinary tract infection.  You have both red blood cells as well as white blood cells.  We will send it out for culture but I like to start you on antibiotics as a precaution.  I like to recheck your urine in about a month and if you still have red blood cells then we can scope you at that time.  If you have any blood clots in your urine or difficulty urinating, please call our office or go to the emergency room.

## 2018-11-13 LAB — URINE CULTURE

## 2018-11-18 ENCOUNTER — Telehealth: Payer: Self-pay

## 2018-11-18 NOTE — Telephone Encounter (Signed)
Patient notified on vmail per DPR 

## 2018-11-18 NOTE — Telephone Encounter (Signed)
-----   Message from Hollice Espy, MD sent at 11/18/2018  8:27 AM EDT ----- Mr. Hayduk know that he did in fact have a urinary tract infection.  This likely explains his symptoms.  I do want him to follow-up with repeat UA as scheduled to ensure that the blood is gone.  Hollice Espy, MD

## 2018-11-19 ENCOUNTER — Telehealth: Payer: Self-pay | Admitting: Urology

## 2018-11-19 NOTE — Telephone Encounter (Signed)
OK to drop of repeat UA/ UCx.    Hollice Espy, MD

## 2018-11-19 NOTE — Telephone Encounter (Signed)
Pt's wife called and states that the pt just finished up his ABX on Tuesday, But it still passing blood when urinating. She also stated that he is very fatigued and having fevers off and on and joint pain. Please advise.

## 2018-11-20 ENCOUNTER — Other Ambulatory Visit: Payer: Self-pay

## 2018-11-20 ENCOUNTER — Other Ambulatory Visit: Payer: Medicare Other

## 2018-11-20 ENCOUNTER — Telehealth: Payer: Self-pay

## 2018-11-20 DIAGNOSIS — R310 Gross hematuria: Secondary | ICD-10-CM

## 2018-11-20 LAB — URINALYSIS, COMPLETE
Bilirubin, UA: NEGATIVE
Glucose, UA: NEGATIVE
Ketones, UA: NEGATIVE
Leukocytes,UA: NEGATIVE
Nitrite, UA: NEGATIVE
Protein,UA: NEGATIVE
Specific Gravity, UA: <1.005 — ABNORMAL LOW (ref 1.005–1.030)
Urobilinogen, Ur: 0.2 mg/dL (ref 0.2–1.0)
pH, UA: 6 (ref 5.0–7.5)

## 2018-11-20 LAB — MICROSCOPIC EXAMINATION
Bacteria, UA: NONE SEEN
RBC: NONE SEEN /hpf (ref 0–2)

## 2018-11-20 NOTE — Telephone Encounter (Signed)
Left message for patient or patients wife to return call.   Per Larene Beach, UA looks clear of infection. If patient is fatigued and still having fever and chills and pain then he needs to go to the ED.

## 2018-11-20 NOTE — Telephone Encounter (Signed)
Pt's wife called back and I read her the message, she voiced understanding.

## 2018-11-22 LAB — URINE CULTURE

## 2018-11-23 NOTE — Telephone Encounter (Signed)
-----   Message from Hollice Espy, MD sent at 11/23/2018  8:12 AM EDT ----- UA and UCx negative.  Looks like abx worked.

## 2018-11-23 NOTE — Telephone Encounter (Signed)
Notified patient of negative ua and ucx. Patient reports hematuria first thing in the morning. Had blood clots until last Friday but this has resolved. Denies trouble with urination or dysuria. Advised patient to call back if he experiences these symptoms and to keep upcoming appointment for cystoscopy with Dr Erlene Quan. Patient and wife express understanding of conversation.

## 2018-12-02 ENCOUNTER — Other Ambulatory Visit: Payer: Self-pay

## 2018-12-03 ENCOUNTER — Other Ambulatory Visit: Payer: Self-pay

## 2018-12-03 ENCOUNTER — Other Ambulatory Visit: Payer: Medicare Other

## 2018-12-03 DIAGNOSIS — R310 Gross hematuria: Secondary | ICD-10-CM

## 2018-12-03 LAB — URINALYSIS, COMPLETE
Bilirubin, UA: NEGATIVE
Glucose, UA: NEGATIVE
Ketones, UA: NEGATIVE
Nitrite, UA: NEGATIVE
Protein,UA: NEGATIVE
RBC, UA: NEGATIVE
Specific Gravity, UA: 1.015 (ref 1.005–1.030)
Urobilinogen, Ur: 0.2 mg/dL (ref 0.2–1.0)
pH, UA: 6.5 (ref 5.0–7.5)

## 2018-12-03 LAB — MICROSCOPIC EXAMINATION
Bacteria, UA: NONE SEEN
RBC: NONE SEEN /hpf (ref 0–2)

## 2018-12-03 MED ORDER — CEPHALEXIN 500 MG PO CAPS: 500.0000 mg | ORAL_CAPSULE | Freq: Four times a day (QID) | ORAL | 0 refills | Status: DC

## 2018-12-03 NOTE — Addendum Note (Signed)
Addended by: Tommy Rainwater on: 12/03/2018 04:03 PM   Modules accepted: Orders

## 2018-12-06 LAB — CULTURE, URINE COMPREHENSIVE

## 2018-12-07 ENCOUNTER — Telehealth: Payer: Self-pay

## 2018-12-07 NOTE — Telephone Encounter (Signed)
-----   Message from Hollice Espy, MD sent at 12/07/2018  9:48 AM EDT ----- Keflex should be effective.  See you later this week.  Hollice Espy, MD

## 2018-12-07 NOTE — Telephone Encounter (Signed)
Patient notified on vmail per DPR 

## 2018-12-08 ENCOUNTER — Encounter: Payer: Self-pay | Admitting: Urology

## 2018-12-08 ENCOUNTER — Ambulatory Visit (INDEPENDENT_AMBULATORY_CARE_PROVIDER_SITE_OTHER): Payer: Medicare Other | Admitting: Urology

## 2018-12-08 ENCOUNTER — Other Ambulatory Visit: Payer: Self-pay

## 2018-12-08 VITALS — BP 169/82 | HR 88 | Ht 74.0 in | Wt 234.0 lb

## 2018-12-08 DIAGNOSIS — N329 Bladder disorder, unspecified: Secondary | ICD-10-CM

## 2018-12-08 DIAGNOSIS — N3001 Acute cystitis with hematuria: Secondary | ICD-10-CM

## 2018-12-08 DIAGNOSIS — R310 Gross hematuria: Secondary | ICD-10-CM

## 2018-12-08 MED ORDER — NONFORMULARY OR COMPOUNDED ITEM: 0 refills | Status: DC

## 2018-12-08 NOTE — Progress Notes (Signed)
   12/08/18  CC:  Chief Complaint  Patient presents with  . 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  He is been complaining of dysuria and gross hematuria.  Ultimately, he was diagnosed with a Klebsiella UTI treated with Bactrim.  This medication was poorly tolerated due to rash.  The patient also reports today that he was having fevers.  He supplied another urine which grew low colony count of Klebsiella the last week.  Since then, has been on Keflex and his symptoms have resolved.  He has no prior history of UTIs in the recent past.  Blood pressure (!) 169/82, pulse 88, height 6\' 2"  (1.88 m), weight 234 lb (106.1 kg). NED. A&Ox3.   No respiratory distress   Abd soft, NT, ND Normal phallus with bilateral descended testicles  Cystoscopy Procedure Note  Patient identification was confirmed, informed consent was obtained, and patient was prepped using Betadine solution.  Lidocaine jelly was administered per urethral meatus.     Pre-Procedure: - Inspection reveals a normal caliber ureteral meatus.  Procedure: The flexible cystoscope was introduced without difficulty - No urethral strictures/lesions are present - Surgically absent prostate  - Normal bladder neck  - Bilateral ureteral orifices identified - Bladder mucosa 3 cm area on the right lateral bladder wall which is slightly raised and hyperpigmented with some overlying adherent debris.  No papillary changes.  And of the bladder is completely normal without erythema. - No bladder stones - No trabeculation  Retroflexion shows normal bladder neck   Post-Procedure: - Patient tolerated the procedure well  Assessment/ Plan:  1. Lesion of bladder Differential diagnosis includes neoplasm versus focal infective cystitis versus radiation cystitis I recommended  that we reevaluate this area in about 2 weeks after he completes his antibiotic course and if the area persists or worsens, proceed to the operating for biopsy He is agreeable this plan  2. Gross hematuria Related likely either to recent UTI infection versus radiation cystitis as above  3. Acute cystitis with hematuria Advised to complete course of Keflex with reevaluation in the near future  Return in about 2 weeks (around 12/22/2018) for cysto.  Hollice Espy, MD

## 2018-12-23 ENCOUNTER — Other Ambulatory Visit: Payer: Self-pay | Admitting: Radiology

## 2018-12-23 ENCOUNTER — Encounter: Payer: Self-pay | Admitting: Urology

## 2018-12-23 ENCOUNTER — Telehealth: Payer: Self-pay | Admitting: Radiology

## 2018-12-23 ENCOUNTER — Other Ambulatory Visit: Payer: Self-pay

## 2018-12-23 ENCOUNTER — Ambulatory Visit (INDEPENDENT_AMBULATORY_CARE_PROVIDER_SITE_OTHER): Payer: Medicare Other | Admitting: Urology

## 2018-12-23 VITALS — BP 165/77 | HR 92 | Ht 74.0 in | Wt 236.0 lb

## 2018-12-23 DIAGNOSIS — N329 Bladder disorder, unspecified: Secondary | ICD-10-CM | POA: Diagnosis not present

## 2018-12-23 LAB — MICROSCOPIC EXAMINATION

## 2018-12-23 LAB — URINALYSIS, COMPLETE
Bilirubin, UA: NEGATIVE
Glucose, UA: NEGATIVE
Ketones, UA: NEGATIVE
Nitrite, UA: NEGATIVE
Protein,UA: NEGATIVE
Specific Gravity, UA: 1.02 (ref 1.005–1.030)
Urobilinogen, Ur: 0.2 mg/dL (ref 0.2–1.0)
pH, UA: 5.5 (ref 5.0–7.5)

## 2018-12-23 NOTE — H&P (View-Only) (Signed)
   12/24/18  CC:  Chief Complaint  Patient presents with  . 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   He is been complaining of dysuria and gross hematuria.  Ultimately, he was diagnosed with a Klebsiella UTI treated with Bactrim.  This medication was poorly tolerated due to rash.  The patient also reports today that he was having fevers.  He supplied another urine which grew low colony count of Klebsiella the last week.  Since then, has been on Keflex and his symptoms have resolved.  He presented to the office on 12/08/2018 for cystoscopy which showed erythema of the bladder, measuring 3 cm in the right lateral bladder wall with a slightly raised area thought to be related to either recent UTI or possibly radiation cystitis.  He returns today after additional time for reevaluation.  He reports that he had another painless episode of gross hematuria 4 days after completing his last course of antibiotics.  Blood pressure (!) 169/82, pulse 88, height 6\' 2"  (1.88 m), weight 234 lb (106.1 kg). NED. A&Ox3.   No respiratory distress   Abd soft, NT, ND Normal phallus with bilateral descended testicles  Cystoscopy Procedure Note  Patient identification was confirmed, informed consent was obtained, and patient was prepped using Betadine solution.  Lidocaine jelly was administered per urethral meatus.     Pre-Procedure: - Inspection reveals a normal caliber ureteral meatus.  Procedure: The flexible cystoscope was introduced without difficulty - No urethral strictures/lesions are present - Surgically absent prostate  - Normal bladder neck  - Bilateral ureteral orifices identified - Bladder mucosa 3 cm area on the right lateral bladder wall which is slightly raised and hyperpigmented with some overlying adherent debris.  No  papillary changes.  This now extends towards the trigone.  The remainder of the bladder is unremarkable. - No bladder stones - No trabeculation  Retroflexion shows normal bladder neck   Post-Procedure: - Patient tolerated the procedure well  Assessment/ Plan:  1. 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  All questions were answered.  Preop urine culture today.  He understands he will need to be COVID tested prior to surgery.  2. Gross hematuria Related likely either to recent UTI infection versus radiation cystitis as above   Hollice Espy, MD

## 2018-12-23 NOTE — Progress Notes (Signed)
   12/24/18  CC:  Chief Complaint  Patient presents with  . 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   He is been complaining of dysuria and gross hematuria.  Ultimately, he was diagnosed with a Klebsiella UTI treated with Bactrim.  This medication was poorly tolerated due to rash.  The patient also reports today that he was having fevers.  He supplied another urine which grew low colony count of Klebsiella the last week.  Since then, has been on Keflex and his symptoms have resolved.  He presented to the office on 12/08/2018 for cystoscopy which showed erythema of the bladder, measuring 3 cm in the right lateral bladder wall with a slightly raised area thought to be related to either recent UTI or possibly radiation cystitis.  He returns today after additional time for reevaluation.  He reports that he had another painless episode of gross hematuria 4 days after completing his last course of antibiotics.  Blood pressure (!) 169/82, pulse 88, height 6\' 2"  (1.88 m), weight 234 lb (106.1 kg). NED. A&Ox3.   No respiratory distress   Abd soft, NT, ND Normal phallus with bilateral descended testicles  Cystoscopy Procedure Note  Patient identification was confirmed, informed consent was obtained, and patient was prepped using Betadine solution.  Lidocaine jelly was administered per urethral meatus.     Pre-Procedure: - Inspection reveals a normal caliber ureteral meatus.  Procedure: The flexible cystoscope was introduced without difficulty - No urethral strictures/lesions are present - Surgically absent prostate  - Normal bladder neck  - Bilateral ureteral orifices identified - Bladder mucosa 3 cm area on the right lateral bladder wall which is slightly raised and hyperpigmented with some overlying adherent debris.  No  papillary changes.  This now extends towards the trigone.  The remainder of the bladder is unremarkable. - No bladder stones - No trabeculation  Retroflexion shows normal bladder neck   Post-Procedure: - Patient tolerated the procedure well  Assessment/ Plan:  1. 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  All questions were answered.  Preop urine culture today.  He understands he will need to be COVID tested prior to surgery.  2. Gross hematuria Related likely either to recent UTI infection versus radiation cystitis as above   Hollice Espy, MD

## 2018-12-23 NOTE — Telephone Encounter (Signed)
Patient was given the [facility] Information form below as well as the Instructions for Pre-Admission Testing form & a map of [facility].    [place], [address], [place] [address] Telephone: [phone] Fax: [phone]   Thank you for choosing [facility] for your upcoming surgery!  We are always here to assist in your urological needs.  Please read the following information with specific details for your upcoming appointments related to your surgery. Please contact [facility] at [phone] Option 3 with any questions.  The Name of Your Surgery: Bladder biopsy, bilateral retrograde pyelogram Your Surgery Date: 01/11/2019 Your Surgeon: Hollice Espy  Please call Same Day Surgery at [phone] between the hours of 1pm-3pm one day prior to your surgery. They will inform you of the time to arrive at Same Day Surgery which is located on the second floor of the [facility].   Please refer to the attached letter regarding instructions for Pre-Admission Testing. You will receive a call from the [facility] office regarding your appointment with them.  The Pre-Admission Testing office is located at [place], on the first floor of the [facility] in [place] (office is to the right as you enter through the [employer] of the [facility]). Please have all medications you are currently taking and your insurance card available.  A COVID-19 test will be required prior to surgery and once test is performed you will need to remain in quarantine until the day of surgery. Patient was advised to have nothing to eat or drink after midnight the night prior to surgery except that he may have only water until 2 hours before surgery with nothing to drink within 2 hours of surgery.  The patient states he currently takes no blood  thinners. Patient's questions were answered and he expressed understanding of these instructions.

## 2018-12-24 ENCOUNTER — Telehealth: Payer: Self-pay | Admitting: Urology

## 2018-12-24 NOTE — Telephone Encounter (Signed)
Pt's wife called and states that he is passing blood clots since his Cysto yesterday. Please advise.

## 2018-12-24 NOTE — Telephone Encounter (Signed)
Spoke with patient's wife and she states that he has passed a few large clots. She was told that this can happen after cysto and to increase water intake to help flush out the clots and to call us if he is unable to urinate

## 2018-12-26 LAB — CULTURE, URINE COMPREHENSIVE

## 2018-12-28 ENCOUNTER — Telehealth: Payer: Self-pay | Admitting: Radiology

## 2018-12-28 DIAGNOSIS — N390 Urinary tract infection, site not specified: Secondary | ICD-10-CM

## 2018-12-28 DIAGNOSIS — R319 Hematuria, unspecified: Secondary | ICD-10-CM

## 2018-12-28 MED ORDER — AMOXICILLIN-POT CLAVULANATE 875-125 MG PO TABS: 1.0000 | ORAL_TABLET | Freq: Two times a day (BID) | ORAL | 0 refills | Status: DC

## 2018-12-28 NOTE — Telephone Encounter (Signed)
-----   Message from Hollice Espy, MD sent at 12/27/2018  2:23 PM EDT ----- Please treat with Augmentin twice daily x 7 days  Hollice Espy, MD

## 2018-12-28 NOTE — Telephone Encounter (Signed)
Notified wife of script sent to pharmacy with instructions for taking medication.

## 2019-01-04 ENCOUNTER — Other Ambulatory Visit: Payer: Self-pay

## 2019-01-04 ENCOUNTER — Encounter
Admission: RE | Admit: 2019-01-04 | Discharge: 2019-01-04 | Disposition: A | Discharge status: Home / Self Care | Payer: Medicare Other | Source: Ambulatory Visit | Attending: Urology | Admitting: Urology

## 2019-01-04 DIAGNOSIS — N329 Bladder disorder, unspecified: Secondary | ICD-10-CM | POA: Insufficient documentation

## 2019-01-04 DIAGNOSIS — Z01818 Encounter for other preprocedural examination: Secondary | ICD-10-CM | POA: Insufficient documentation

## 2019-01-04 DIAGNOSIS — Z01810 Encounter for preprocedural cardiovascular examination: Secondary | ICD-10-CM

## 2019-01-04 DIAGNOSIS — Z20828 Contact with and (suspected) exposure to other viral communicable diseases: Secondary | ICD-10-CM | POA: Diagnosis not present

## 2019-01-04 DIAGNOSIS — I10 Essential (primary) hypertension: Secondary | ICD-10-CM | POA: Insufficient documentation

## 2019-01-04 LAB — CBC
HCT: 39.4 % (ref 39.0–52.0)
Hemoglobin: 13 g/dL (ref 13.0–17.0)
MCH: 28 pg (ref 26.0–34.0)
MCHC: 33 g/dL (ref 30.0–36.0)
MCV: 84.7 fL (ref 80.0–100.0)
Platelets: 282 10*3/uL (ref 150–400)
RBC: 4.65 MIL/uL (ref 4.22–5.81)
RDW: 14.1 % (ref 11.5–15.5)
WBC: 6.8 10*3/uL (ref 4.0–10.5)
nRBC: 0 % (ref 0.0–0.2)

## 2019-01-04 NOTE — Patient Instructions (Signed)
Your procedure is scheduled on: Mon 01/11/19 Report to [facility] ON 2ND FLOOR MEDICAL MALL ENTRANCE. To find out your arrival time please call [phone] between 1PM - 3PM on Fri 01/08/19.  Remember: Instructions that are not followed completely may result in serious medical risk, up to and including death, or upon the discretion of your surgeon and anesthesiologist your surgery may need to be rescheduled.     _X__ 1. Do not eat food after midnight the night before your procedure.                 No gum chewing or hard candies. You may drink clear liquids up to 2 hours                 before you are scheduled to arrive for your surgery- DO not drink clear                 liquids within 2 hours of the start of your surgery.                 Clear Liquids include:  water, apple juice without pulp, clear carbohydrate                 drink such as Clearfast or Gatorade, Black Coffee or Tea (Do not add                 anything to coffee or tea). Diabetics water only  __X__2.  On the morning of surgery brush your teeth with toothpaste and water, you                 may rinse your mouth with mouthwash if you wish.  Do not swallow any              toothpaste of mouthwash.     _X__ 3.  No Alcohol for 24 hours before or after surgery.   _X__ 4.  Do Not Smoke or use e-cigarettes For 24 Hours Prior to Your Surgery.                 Do not use any chewable tobacco products for at least 6 hours prior to                 surgery.  ____  5.  Bring all medications with you on the day of surgery if instructed.   __X__  6.  Notify your doctor if there is any change in your medical condition      (cold, fever, infections).     Do not wear jewelry, make-up, hairpins, clips or nail polish. Do not wear lotions, powders, or perfumes.  Do not shave 48 hours prior to surgery. Men may shave face and neck. Do not bring valuables to the hospital.    [facility] is not responsible for any  belongings or valuables.  Contacts, dentures/partials or body piercings may not be worn into surgery. Bring a case for your contacts, glasses or hearing aids, a denture cup will be supplied. Leave your suitcase in the car. After surgery it may be brought to your room. For patients admitted to the hospital, discharge time is determined by your treatment team.   Patients discharged the day of surgery will not be allowed to drive home.   Please read over the following fact sheets that you were given:   MRSA Information  __X__ Take these medicines the morning of surgery with A SIP OF WATER:  1. metoprolol succinate (TOPROL-XL)  2.   3.   4.  5.  6.  ____ Fleet Enema (as directed)   ____ Use CHG Soap/SAGE wipes as directed  ____ Use inhalers on the day of surgery  ____ Stop metformin/Janumet/Farxiga 2 days prior to surgery    ____ Take 1/2 of usual insulin dose the night before surgery. No insulin the morning          of surgery.   ____ Stop Blood Thinners Coumadin/Plavix/Xarelto/Pleta/Pradaxa/Eliquis/Effient/Aspirin  on   Or contact your Surgeon, Cardiologist or Medical Doctor regarding  ability to stop your blood thinners  __X__ Stop Anti-inflammatories 7 days before surgery such as Advil, Ibuprofen, Motrin,  BC or Goodies Powder, Naprosyn, Naproxen, Aleve, Aspirin    __X__ Stop all herbal supplements, fish oil or vitamin E until after surgery.    ____ Bring C-Pap to the hospital.

## 2019-01-07 ENCOUNTER — Other Ambulatory Visit
Admission: RE | Admit: 2019-01-07 | Discharge: 2019-01-07 | Disposition: A | Discharge status: Home / Self Care | Payer: Medicare Other | Source: Ambulatory Visit | Attending: Urology | Admitting: Urology

## 2019-01-07 ENCOUNTER — Other Ambulatory Visit: Payer: Self-pay

## 2019-01-07 DIAGNOSIS — Z01818 Encounter for other preprocedural examination: Secondary | ICD-10-CM | POA: Diagnosis not present

## 2019-01-07 LAB — SARS CORONAVIRUS 2 (TAT 6-24 HRS): SARS Coronavirus 2: NEGATIVE

## 2019-01-11 ENCOUNTER — Encounter
Admission: RE | Disposition: A | Discharge status: Home / Self Care | Payer: Self-pay | Source: Home / Self Care | Attending: Urology

## 2019-01-11 ENCOUNTER — Ambulatory Visit
Admission: RE | Admit: 2019-01-11 | Discharge: 2019-01-11 | Disposition: A | Discharge status: Home / Self Care | Payer: Medicare Other | Attending: Urology | Admitting: Urology

## 2019-01-11 ENCOUNTER — Ambulatory Visit: Payer: Medicare Other | Admitting: Certified Registered"

## 2019-01-11 ENCOUNTER — Other Ambulatory Visit: Payer: Self-pay

## 2019-01-11 DIAGNOSIS — N3289 Other specified disorders of bladder: Secondary | ICD-10-CM

## 2019-01-11 DIAGNOSIS — Z8546 Personal history of malignant neoplasm of prostate: Secondary | ICD-10-CM | POA: Diagnosis not present

## 2019-01-11 DIAGNOSIS — I10 Essential (primary) hypertension: Secondary | ICD-10-CM | POA: Diagnosis not present

## 2019-01-11 DIAGNOSIS — N329 Bladder disorder, unspecified: Secondary | ICD-10-CM

## 2019-01-11 DIAGNOSIS — Z87891 Personal history of nicotine dependence: Secondary | ICD-10-CM | POA: Insufficient documentation

## 2019-01-11 DIAGNOSIS — Z923 Personal history of irradiation: Secondary | ICD-10-CM | POA: Insufficient documentation

## 2019-01-11 DIAGNOSIS — K219 Gastro-esophageal reflux disease without esophagitis: Secondary | ICD-10-CM | POA: Insufficient documentation

## 2019-01-11 DIAGNOSIS — G709 Myoneural disorder, unspecified: Secondary | ICD-10-CM | POA: Insufficient documentation

## 2019-01-11 DIAGNOSIS — N3041 Irradiation cystitis with hematuria: Secondary | ICD-10-CM | POA: Insufficient documentation

## 2019-01-11 DIAGNOSIS — R310 Gross hematuria: Secondary | ICD-10-CM | POA: Diagnosis present

## 2019-01-11 HISTORY — PX: CYSTOSCOPY WITH BIOPSY: SHX5122

## 2019-01-11 HISTORY — PX: CYSTOSCOPY W/ RETROGRADES: SHX1426

## 2019-01-11 SURGERY — CYSTOSCOPY, WITH BIOPSY
Anesthesia: General

## 2019-01-11 MED ORDER — FAMOTIDINE 20 MG PO TABS
20.0000 mg | ORAL_TABLET | Freq: Once | ORAL | Status: AC
  Administered 2019-01-11: 09:00:00 20 mg via ORAL

## 2019-01-11 MED ORDER — FENTANYL CITRATE (PF) 100 MCG/2ML IJ SOLN
INTRAMUSCULAR | Status: DC | PRN
  Administered 2019-01-11 (×2): 25 ug via INTRAVENOUS
  Administered 2019-01-11: 50 ug via INTRAVENOUS

## 2019-01-11 MED ORDER — LIDOCAINE HCL (CARDIAC) PF 100 MG/5ML IV SOSY
PREFILLED_SYRINGE | INTRAVENOUS | Status: DC | PRN
  Administered 2019-01-11: 100 mg via INTRAVENOUS

## 2019-01-11 MED ORDER — OXYBUTYNIN CHLORIDE 5 MG PO TABS: 5.0000 mg | ORAL_TABLET | Freq: Three times a day (TID) | ORAL | 0 refills | Status: DC | PRN

## 2019-01-11 MED ORDER — IOHEXOL 180 MG/ML  SOLN
INTRAMUSCULAR | Status: DC | PRN
  Administered 2019-01-11: 10 mL

## 2019-01-11 MED ORDER — ONDANSETRON HCL 4 MG/2ML IJ SOLN: 4.0000 mg | Freq: Once | INTRAMUSCULAR | Status: DC | PRN

## 2019-01-11 MED ORDER — FENTANYL CITRATE (PF) 100 MCG/2ML IJ SOLN: 25.0000 ug | INTRAMUSCULAR | Status: DC | PRN

## 2019-01-11 MED ORDER — FENTANYL CITRATE (PF) 100 MCG/2ML IJ SOLN
INTRAMUSCULAR | Status: AC
  Filled 2019-01-11: qty 2

## 2019-01-11 MED ORDER — CEFAZOLIN SODIUM-DEXTROSE 2-4 GM/100ML-% IV SOLN
INTRAVENOUS | Status: AC
  Filled 2019-01-11: qty 100

## 2019-01-11 MED ORDER — PROPOFOL 10 MG/ML IV BOLUS
INTRAVENOUS | Status: AC
  Filled 2019-01-11: qty 20

## 2019-01-11 MED ORDER — LIDOCAINE HCL (PF) 2 % IJ SOLN
INTRAMUSCULAR | Status: AC
  Filled 2019-01-11: qty 10

## 2019-01-11 MED ORDER — LACTATED RINGERS IV SOLN
INTRAVENOUS | Status: DC
  Administered 2019-01-11: 09:00:00 via INTRAVENOUS

## 2019-01-11 MED ORDER — PROPOFOL 10 MG/ML IV BOLUS
INTRAVENOUS | Status: DC | PRN
  Administered 2019-01-11: 30 mg via INTRAVENOUS
  Administered 2019-01-11: 150 mg via INTRAVENOUS

## 2019-01-11 MED ORDER — FAMOTIDINE 20 MG PO TABS
ORAL_TABLET | ORAL | Status: AC
  Administered 2019-01-11: 20 mg via ORAL
  Filled 2019-01-11: qty 1

## 2019-01-11 MED ORDER — CEFAZOLIN SODIUM-DEXTROSE 2-4 GM/100ML-% IV SOLN
2.0000 g | INTRAVENOUS | Status: AC
  Administered 2019-01-11: 2 g via INTRAVENOUS

## 2019-01-11 SURGICAL SUPPLY — 24 items
BAG DRAIN CYSTO-URO LG1000N (MISCELLANEOUS) ×4 IMPLANT
BRUSH SCRUB EZ  4% CHG (MISCELLANEOUS) ×2
BRUSH SCRUB EZ 4% CHG (MISCELLANEOUS) ×2 IMPLANT
CATH URETL 5X70 OPEN END (CATHETERS) ×4 IMPLANT
COVER WAND RF STERILE (DRAPES) ×4 IMPLANT
DRAPE UTILITY 15X26 TOWEL STRL (DRAPES) ×4 IMPLANT
DRSG TELFA 4X3 1S NADH ST (GAUZE/BANDAGES/DRESSINGS) ×4 IMPLANT
ELECT REM PT RETURN 9FT ADLT (ELECTROSURGICAL) ×4
ELECTRODE REM PT RTRN 9FT ADLT (ELECTROSURGICAL) ×2 IMPLANT
GLOVE BIO SURGEON STRL SZ 6.5 (GLOVE) ×3 IMPLANT
GLOVE BIO SURGEONS STRL SZ 6.5 (GLOVE) ×1
GOWN STRL REUS W/ TWL LRG LVL3 (GOWN DISPOSABLE) ×4 IMPLANT
GOWN STRL REUS W/TWL LRG LVL3 (GOWN DISPOSABLE) ×4
GUIDEWIRE STR DUAL SENSOR (WIRE) ×4 IMPLANT
KIT TURNOVER CYSTO (KITS) ×4 IMPLANT
NDL SAFETY ECLIPSE 18X1.5 (NEEDLE) ×2 IMPLANT
NEEDLE HYPO 18GX1.5 SHARP (NEEDLE) ×2
PACK CYSTO AR (MISCELLANEOUS) ×4 IMPLANT
SET CYSTO W/LG BORE CLAMP LF (SET/KITS/TRAYS/PACK) ×4 IMPLANT
SOL .9 NS 3000ML IRR  AL (IV SOLUTION) ×2
SOL .9 NS 3000ML IRR UROMATIC (IV SOLUTION) ×2 IMPLANT
SURGILUBE 2OZ TUBE FLIPTOP (MISCELLANEOUS) ×4 IMPLANT
WATER STERILE IRR 1000ML POUR (IV SOLUTION) ×4 IMPLANT
WATER STERILE IRR 3000ML UROMA (IV SOLUTION) ×4 IMPLANT

## 2019-01-11 NOTE — Discharge Instructions (Signed)
Transurethral Resection of Bladder Tumor (TURBT) or Bladder Biopsy ° ° °Definition: ° Transurethral Resection of the Bladder Tumor is a surgical procedure used to diagnose and remove tumors within the bladder. TURBT is the most common treatment for early stage bladder cancer. ° °General instructions: °   ° Your recent bladder surgery requires very little post hospital care but some definite precautions. ° °Despite the fact that no skin incisions were used, the area around the bladder incisions are raw and covered with scabs to promote healing and prevent bleeding. Certain precautions are needed to insure that the scabs are not disturbed over the next 2-4 weeks while the healing proceeds. ° °Because the raw surface inside your bladder and the irritating effects of urine you may expect frequency of urination and/or urgency (a stronger desire to urinate) and perhaps even getting up at night more often. This will usually resolve or improve slowly over the healing period. You may see some blood in your urine over the first 6 weeks. Do not be alarmed, even if the urine was clear for a while. Get off your feet and drink lots of fluids until clearing occurs. If you start to pass clots or don't improve call us. ° °Diet: ° °You may return to your normal diet immediately. Because of the raw surface of your bladder, alcohol, spicy foods, foods high in acid and drinks with caffeine may cause irritation or frequency and should be used in moderation. To keep your urine flowing freely and avoid constipation, drink plenty of fluids during the day (8-10 glasses). Tip: Avoid cranberry juice because it is very acidic. ° °Activity: ° °Your physical activity doesn't need to be restricted. However, if you are very active, you may see some blood in the urine. We suggest that you reduce your activity under the circumstances until the bleeding has stopped. ° °Bowels: ° °It is important to keep your bowels regular during the postoperative  period. Straining with bowel movements can cause bleeding. A bowel movement every other day is reasonable. Use a mild laxative if needed, such as milk of magnesia 2-3 tablespoons, or 2 Dulcolax tablets. Call if you continue to have problems. If you had been taking narcotics for pain, before, during or after your surgery, you may be constipated. Take a laxative if necessary. ° ° ° °Medication: ° °You should resume your pre-surgery medications unless told not to. In addition you may be given an antibiotic to prevent or treat infection. Antibiotics are not always necessary. All medication should be taken as prescribed until the bottles are finished unless you are having an unusual reaction to one of the drugs. ° ° °[facility] °[place], [place] [address] °[phone] ° ° ° °AMBULATORY SURGERY  °DISCHARGE INSTRUCTIONS ° ° °1) The drugs that you were given will stay in your system until tomorrow so for the next 24 hours you should not: ° °A) Drive an automobile °B) Make any legal decisions °C) Drink any alcoholic beverage ° ° °2) You may resume regular meals tomorrow.  Today it is better to start with liquids and gradually work up to solid foods. ° °You may eat anything you prefer, but it is better to start with liquids, then soup and crackers, and gradually work up to solid foods. ° ° °3) Please notify your doctor immediately if you have any unusual bleeding, trouble breathing, redness and pain at the surgery site, drainage, fever, or pain not relieved by medication. ° ° ° °4) Additional Instructions: ° ° ° ° ° ° ° °  Please contact your physician with any problems or Same Day Surgery at [phone], Monday through Friday 6 am to 4 pm, or [place] [facility] number at [phone]. ° °

## 2019-01-11 NOTE — Transfer of Care (Signed)
Immediate Anesthesia Transfer of Care Note  Patient: Lee Graham  Procedure(s) Performed: CYSTOSCOPY WITH Bladder BIOPSY (N/A ) CYSTOSCOPY WITH RETROGRADE PYELOGRAM (Bilateral )  Patient Location: PACU  Anesthesia Type:General  Level of Consciousness: awake, alert  and oriented  Airway & Oxygen Therapy: Patient Spontanous Breathing and Patient connected to face mask oxygen  Post-op Assessment: Report given to RN and Post -op Vital signs reviewed and stable  Post vital signs: Reviewed and stable  Last Vitals:  Vitals Value Taken Time  BP    Temp    Pulse    Resp    SpO2      Last Pain:  Vitals:   01/11/19 0818  TempSrc: Temporal  PainSc: 0-No pain         Complications: No apparent anesthesia complications

## 2019-01-11 NOTE — Anesthesia Post-op Follow-up Note (Signed)
Anesthesia QCDR form completed.        

## 2019-01-11 NOTE — Anesthesia Postprocedure Evaluation (Signed)
Anesthesia Post Note  Patient: Lee Graham  Procedure(s) Performed: CYSTOSCOPY WITH Bladder BIOPSY (N/A ) CYSTOSCOPY WITH RETROGRADE PYELOGRAM (Bilateral )  Anesthesia Type: General     Last Vitals:  Vitals:   01/11/19 0818 01/11/19 1014  BP: (!) 142/85 139/84  Pulse: 73 75  Resp: 18 (!) 24  Temp: (!) 36.3 C (!) 36.1 C  SpO2: 97% 100%    Last Pain:  Vitals:   01/11/19 1014  TempSrc:   PainSc: [place] Robina Hamor

## 2019-01-11 NOTE — Op Note (Signed)
Date of procedure: 01/11/19  Preoperative diagnosis:  1. Gross hematuria 2. Bladder lesion 3. History of prostate cancer status post prostatectomy and adjuvant radiation  Postoperative diagnosis:  1. Same as above  Procedure: 1. Cystoscopy 2. Bladder biopsy 3. Bilateral retrograde pyelogram  Surgeon: Hollice Espy, MD  Anesthesia: General  Complications: None  Intraoperative findings: Prostate surgically absent.  Patchy erythema which had almost raspberry-like appearance on erythematous/hypervascular base involving trigone and bladder base, up to 1 cm.  Remainder of the bladder spared.  No obvious papillary lesions.  Normal bilateral retrograde pyelogram.  EBL: Minimal  Specimens: Bladder biopsy  Drains: None  Indication: Lee Graham is a 68 y.o. patient with personal history of prostate cancer status post prostatectomy with adjuvant radiation with episodic gross hematuria found to have bladder lesions involving the trigone/bladder neck/bladder base.  After reviewing the management options for treatment, he elected to proceed with the above surgical procedure(s). We have discussed the potential benefits and risks of the procedure, side effects of the proposed treatment, the likelihood of the patient achieving the goals of the procedure, and any potential problems that might occur during the procedure or recuperation. Informed consent has been obtained.  Description of procedure:  The patient was taken to the operating room and general anesthesia was induced.  The patient was placed in the dorsal lithotomy position, prepped and draped in the usual sterile fashion, and preoperative antibiotics were administered. A preoperative time-out was performed.   21 [place] scope was advanced per urethra into the bladder.  Notably, the prostate was surgically absent.  As described above, there was patchy erythema with some areas of raised hypervascularity on erythematous base which was  patchy involving the trigone, bladder neck, and base of bladder.  The remainder of bladder was spared.  There are no other bladder lesions appreciated.  There was no frondular change or obvious tumor.  Attention was first turned to the left ureteral orifice.  The UO itself had a somewhat of a stenotic appearance however was easily cannulated using a 5 [place] open-ended ureteral catheter.  Gentle retrograde pyelogram on this side revealed a normal caliber ureter without hydroureteronephrosis or filling defects.  The same exact procedure was then performed on the right side cannulating this UO which also had a somewhat stenotic appearance with a 5 [place] open-ended ureteral catheter.  Again, no hydroureteronephrosis or filling defects were appreciated.  Finally, cold cup biopsy forceps were used to take representative biopsies of the lesions along the trigone, bladder neck, and posterior bladder wall.  Approximately 5 or 6 small biopsies were taken.  These were passed off the field as bladder biopsies.  These are relatively superficial given that the lesions appear to be involving mostly the mucosa based on appearance.  Bugbee electrocautery was then used to fulgurate the base of these lesions additional areas adjacent to these areas which were more raised were fulgurated with therapeutic intent.  Finally, excellent hemostasis was achieved.  Patient was then clean and dry, repositioned supine position, reversed myesthesia, and taken the PACU in stable condition.  Plan: I will call the patient with his pathology results.  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  Follow plan to be devised based on pathology results.  Hollice Espy, M.D.

## 2019-01-11 NOTE — Anesthesia Procedure Notes (Signed)
Procedure Name: LMA Insertion Date/Time: 01/11/2019 9:37 AM Performed by: Rona Ravens, CRNA Pre-anesthesia Checklist: Patient identified, Emergency Drugs available, Suction available, Patient being monitored and Timeout performed Patient Re-evaluated:Patient Re-evaluated prior to induction Oxygen Delivery Method: Circle system utilized Preoxygenation: Pre-oxygenation with 100% oxygen Induction Type: IV induction LMA: LMA inserted LMA Size: 4.5 Number of attempts: 1 Tube secured with: Tape Dental Injury: Teeth and Oropharynx as per pre-operative assessment

## 2019-01-11 NOTE — Anesthesia Preprocedure Evaluation (Signed)
Anesthesia Evaluation  Patient identified by MRN, date of birth, ID band Patient awake    Reviewed: Allergy & Precautions, H&P , NPO status , Patient's Chart, lab work & pertinent test results, reviewed documented beta blocker date and time   Airway Mallampati: II  TM Distance: >3 FB Neck ROM: full    Dental  (+) Teeth Intact   Pulmonary neg pulmonary ROS, former smoker,    Pulmonary exam normal        Cardiovascular Exercise Tolerance: Good hypertension, On Medications negative cardio ROS Normal cardiovascular exam Rate:Normal     Neuro/Psych  Neuromuscular disease negative neurological ROS  negative psych ROS   GI/Hepatic negative GI ROS, Neg liver ROS, GERD  Medicated,  Endo/Other  negative endocrine ROS  Renal/GU negative Renal ROS  negative genitourinary   Musculoskeletal   Abdominal   Peds  Hematology negative hematology ROS (+)   Anesthesia Other Findings   Reproductive/Obstetrics negative OB ROS                             Anesthesia Physical Anesthesia Plan  ASA: III  Anesthesia Plan: General LMA   Post-op Pain Management:    Induction:   PONV Risk Score and Plan:   Airway Management Planned:   Additional Equipment:   Intra-op Plan:   Post-operative Plan:   Informed Consent: I have reviewed the patients History and Physical, chart, labs and discussed the procedure including the risks, benefits and alternatives for the proposed anesthesia with the patient or authorized representative who has indicated his/her understanding and acceptance.       Plan Discussed with: CRNA  Anesthesia Plan Comments:         Anesthesia Quick Evaluation

## 2019-01-11 NOTE — Interval H&P Note (Signed)
History and Physical Interval Note:  01/11/2019 8:57 AM  Lee Graham  has presented today for surgery, with the diagnosis of bladder lesion.  The various methods of treatment have been discussed with the patient and family. After consideration of risks, benefits and other options for treatment, the patient has consented to  Procedure(s): CYSTOSCOPY WITH Bladder BIOPSY (N/A) CYSTOSCOPY WITH RETROGRADE PYELOGRAM (Bilateral) as a surgical intervention.  The patient's history has been reviewed, patient examined, no change in status, stable for surgery.  I have reviewed the patient's chart and labs.  Questions were answered to the patient's satisfaction.    RRR CTAB  Hollice Espy

## 2019-01-12 ENCOUNTER — Encounter: Payer: Self-pay | Admitting: Urology

## 2019-01-12 LAB — SURGICAL PATHOLOGY

## 2019-01-12 NOTE — Anesthesia Postprocedure Evaluation (Signed)
Anesthesia Post Note  Patient: GILAD DUGGER  Procedure(s) Performed: CYSTOSCOPY WITH Bladder BIOPSY (N/A ) CYSTOSCOPY WITH RETROGRADE PYELOGRAM (Bilateral )  Patient location during evaluation: PACU Anesthesia Type: General Level of consciousness: awake and alert Pain management: pain level controlled Vital Signs Assessment: post-procedure vital signs reviewed and stable Respiratory status: spontaneous breathing, nonlabored ventilation, respiratory function stable and patient connected to nasal cannula oxygen Cardiovascular status: blood pressure returned to baseline and stable Postop Assessment: no apparent nausea or vomiting Anesthetic complications: no     Last Vitals:  Vitals:   01/11/19 1035 01/11/19 1047  BP: (!) 154/91 (!) 148/87  Pulse: 74 67  Resp: 19 18  Temp: (!) 36.2 C (!) 36.2 C  SpO2: 98% 97%    Last Pain:  Vitals:   01/11/19 1047  TempSrc: Temporal  PainSc: 0-No pain                 Molli Barrows

## 2019-01-15 ENCOUNTER — Telehealth: Payer: Self-pay | Admitting: Urology

## 2019-01-15 NOTE — Telephone Encounter (Signed)
Patients wife states Dr. Erlene Quan told her results from Cysto Bladder Biopsy would be available today. She would like for someone to call her with results as soon as possible. (biopsy done on 01/11/19)

## 2019-01-15 NOTE — Telephone Encounter (Signed)
Called patient and his wife to discuss surgical pathology.  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  At this point time, his bleeding is not particularly severe.  Explained that hyperbaric oxygen is typically reserved for severe bleeding requiring blood transfusions admissions, irrigations amongst others.  He will call us and let us know how often frequent he is having these bouts of bleeding to build to document the severity of his symptoms.  He will let us know if he needs any medication for symptom control over the next few weeks.  We will go ahead and keep his follow-up in December as scheduled.  All questions were answered.  Hollice Espy, MD

## 2019-01-15 NOTE — Telephone Encounter (Signed)
They were called earlier today.  Please see phone note.

## 2019-01-15 NOTE — Telephone Encounter (Signed)
Please advise on result.

## 2019-03-25 ENCOUNTER — Encounter: Payer: Self-pay | Admitting: Radiation Oncology

## 2019-03-25 ENCOUNTER — Ambulatory Visit
Admission: RE | Admit: 2019-03-25 | Discharge: 2019-03-25 | Disposition: A | Discharge status: Home / Self Care | Payer: Medicare Other | Source: Ambulatory Visit | Attending: Radiation Oncology | Admitting: Radiation Oncology

## 2019-03-25 ENCOUNTER — Other Ambulatory Visit: Payer: Self-pay

## 2019-03-25 VITALS — BP 142/75 | HR 85 | Temp 97.3°F | Resp 16 | Wt 229.1 lb

## 2019-03-25 DIAGNOSIS — C61 Malignant neoplasm of prostate: Secondary | ICD-10-CM | POA: Diagnosis not present

## 2019-03-25 DIAGNOSIS — Z923 Personal history of irradiation: Secondary | ICD-10-CM | POA: Diagnosis not present

## 2019-03-25 DIAGNOSIS — R319 Hematuria, unspecified: Secondary | ICD-10-CM | POA: Diagnosis not present

## 2019-03-25 NOTE — Progress Notes (Signed)
Radiation Oncology Follow up Note  Name: Lee Graham   Date:   03/25/2019 MRN:  [id number] DOB: 1951/04/29    This 68 y.o. male presents to the clinic today for 2-year follow-up status post salvage radiation therapy for Gleason 9 (4+5) adenocarcinoma the prostate status post robotic prostatectomy with extraprostatic extension as well as right seminal vesicle involvement and bladder neck invasion.  REFERRING PROVIDER: Albina Billet, MD  HPI: Patient is a 68 year old male now 2 years having completed salvage radiation therapy.  He had a Gleason 9 (4+5) adenocarcinoma the prostate status post robotic cystoprostatectomy with extraprostatic extension seminal vesicle involvement as well as microscopic bladder neck invasion.  He is seen today in routine follow-up he has developed some hematuria is being treated by Dr. Erlene Quan with his bladder showing some changes consistent with radiation cystitis.  He also claims to have urinary incontinence all this goes back to his prostatectomy.Marland Kitchen  He is not anemic.  His bleeding is intermittent.  His most recent PSA is less than 0.1  COMPLICATIONS OF TREATMENT: present  FOLLOW UP COMPLIANCE: keeps appointments   PHYSICAL EXAM:  BP (!) 142/75 (BP Location: Left Arm, Patient Position: Sitting)   Pulse 85   Temp (!) 97.3 F (36.3 C) (Tympanic)   Resp 16   Wt 229 lb 1.6 oz (103.9 kg)   BMI 29.41 kg/m  Well-developed well-nourished patient in NAD. HEENT reveals PERLA, EOMI, discs not visualized.  Oral cavity is clear. No oral mucosal lesions are identified. Neck is clear without evidence of cervical or supraclavicular adenopathy. Lungs are clear to A&P. Cardiac examination is essentially unremarkable with regular rate and rhythm without murmur rub or thrill. Abdomen is benign with no organomegaly or masses noted. Motor sensory and DTR levels are equal and symmetric in the upper and lower extremities. Cranial nerves II through XII are grossly intact.  Proprioception is intact. No peripheral adenopathy or edema is identified. No motor or sensory levels are noted. Crude visual fields are within normal range.  RADIOLOGY RESULTS: No current films for review  PLAN: Present time of gone over with his wife who was present the reason why we had to treat his lower bladder with high-dose radiation as there was microscopic involvement.  He is biochemically free of disease at this time.  He continues close follow-up care with Dr. Erlene Quan.  I have asked to see him back in 1 year for follow-up.  Patient knows to call with any concerns.  I would like to take this opportunity to thank you for allowing me to participate in the care of your patient.Noreene Filbert, MD

## 2019-04-27 ENCOUNTER — Other Ambulatory Visit: Payer: Medicare Other

## 2019-04-27 ENCOUNTER — Other Ambulatory Visit: Payer: Self-pay | Admitting: *Deleted

## 2019-04-27 ENCOUNTER — Other Ambulatory Visit: Payer: Self-pay

## 2019-04-27 DIAGNOSIS — C61 Malignant neoplasm of prostate: Secondary | ICD-10-CM

## 2019-04-28 LAB — PSA: Prostate Specific Ag, Serum: <0.1 ng/mL (ref 0.0–4.0)

## 2019-05-04 ENCOUNTER — Other Ambulatory Visit: Payer: Self-pay

## 2019-05-04 ENCOUNTER — Ambulatory Visit (INDEPENDENT_AMBULATORY_CARE_PROVIDER_SITE_OTHER): Payer: Medicare Other | Admitting: Urology

## 2019-05-04 VITALS — BP 169/71 | HR 82 | Ht 74.0 in | Wt 220.0 lb

## 2019-05-04 DIAGNOSIS — N5231 Erectile dysfunction following radical prostatectomy: Secondary | ICD-10-CM

## 2019-05-04 DIAGNOSIS — C61 Malignant neoplasm of prostate: Secondary | ICD-10-CM | POA: Diagnosis not present

## 2019-05-04 DIAGNOSIS — N3040 Irradiation cystitis without hematuria: Secondary | ICD-10-CM

## 2019-05-04 NOTE — Progress Notes (Signed)
05/04/2019 11:04 AM   Lee Graham 02-05-1951 [id number]  Referring provider: Albina Billet, MD [address]   [place],  [place] [address]  Chief Complaint  Patient presents with  . Follow-up    HPI: 68 year old male with a personal history of high risk prostate cancer status post prostatectomy 12/2016 followed by adjuvant radiation.  Surgical pathology consistent with Gleason 3+5, focal extraprostatic extension, right seminal vesicle involvement, and microscopic bladder neck invasion all present. Surgical margins negative. Negative lymph nodes. pT2bN0 M0.   Postoperatively, his PSA was detectable at 0.2 ng/dL. He since completed salvage radiation.  He was on a short course of Lupron which was well-tolerated and has had no subsequent therapy.  More recently, he experienced gross hematuria and was found to have an erythematous patch for which he underwent biopsy on 01/11/2019 along with bilateral retrogrades.  This indicated findings consistent with radiation cystitis.  Since his last visit almost 4 months ago, has had 1 more episode of gross hematuria which was painless.  He denies any significant urgency frequency or significant leakage.  No dysuria.  He is overall satisfied with his voiding at this time.  He continues to use Trimix, 1 mL as needed.  He is concerned today about the increased cost of this medication.  Additionally, he notes that the last time he received this medication, it did not seem to be as effective and it was distributed in a different manner, prefilled vials instead of syringes.  He mentions today that he has been going around with the idea of a penile prosthesis but does not feel it is the right time due to other social issues.  Most recent PSA on 04/27/2019 undetectable.  PMH: Past Medical History:  Diagnosis Date  . Arthritis   . Cancer ([place]) 11/13/2016   prostate  . Deafness in left ear    ONLY 30% HEARING IN RIGHT EAR  . GERD  (gastroesophageal reflux disease)    H/O  . Hyperlipidemia   . Hypertension     Surgical History: Past Surgical History:  Procedure Laterality Date  . COLONOSCOPY WITH PROPOFOL N/A 08/30/2015   Procedure: COLONOSCOPY WITH PROPOFOL;  Surgeon: Christene Lye, MD;  Location: [facility];  Service: Endoscopy;  Laterality: N/A;  . CYSTOSCOPY W/ RETROGRADES Bilateral 01/11/2019   Procedure: CYSTOSCOPY WITH RETROGRADE PYELOGRAM;  Surgeon: Hollice Espy, MD;  Location: [facility];  Service: Urology;  Laterality: Bilateral;  . CYSTOSCOPY WITH BIOPSY N/A 01/11/2019   Procedure: CYSTOSCOPY WITH Bladder BIOPSY;  Surgeon: Hollice Espy, MD;  Location: [facility];  Service: Urology;  Laterality: N/A;  . FINGER SURGERY Right 2008   with skin graft   . LIPOMA EXCISION Bilateral 03/13/2018   Procedure: EXCISION LIPOMA- Right  SHOULDER AND RIGHT BACK;  Surgeon: Robert Bellow, MD;  Location: [facility] ORS;  Service: General;  Laterality: Bilateral;  . ROBOT ASSISTED LAPAROSCOPIC RADICAL PROSTATECTOMY N/A 12/09/2016   Procedure: ROBOTIC ASSISTED LAPAROSCOPIC RADICAL PROSTATECTOMY WITH BILATERAL PELVIC NODE DISSECTION;  Surgeon: Hollice Espy, MD;  Location: [facility];  Service: Urology;  Laterality: N/A;    Home Medications:  Allergies as of 05/04/2019      Reactions   Bactrim [sulfamethoxazole-trimethoprim] Nausea And Vomiting, Other (See Comments)   hallucinations      Medication List       Accurate as of May 04, 2019 11:04 AM. If you have any questions, ask your nurse or doctor.        metoprolol succinate 50  MG 24 hr tablet Commonly known as: TOPROL-XL Take 50 mg by mouth every morning.   NONFORMULARY OR COMPOUNDED ITEM Trimix (30/1/10)-(Pap/Phent/PGE)  Dosage: Inject 1 cc per injection  Vial 11ml  Qty 1 Refills [facility] [phone] Fax [phone]   oxybutynin 5 MG tablet Commonly known as: DITROPAN Take 1 tablet (5 mg total) by mouth every 8 (eight)  hours as needed for bladder spasms.   simvastatin 40 MG tablet Commonly known as: ZOCOR Take 40 mg by mouth daily at 6 PM.   telmisartan 20 MG tablet Commonly known as: MICARDIS Take 20 mg by mouth every morning.       Allergies:  Allergies  Allergen Reactions  . Bactrim [Sulfamethoxazole-Trimethoprim] Nausea And Vomiting and Other (See Comments)    hallucinations    Family History: Family History  Problem Relation Age of Onset  . Colon cancer Father   . Prostate cancer Neg Hx   . Bladder Cancer Neg Hx   . Kidney cancer Neg Hx     Social History:  reports that he quit smoking about 48 years ago. His smoking use included cigars. He has quit using smokeless tobacco. He reports that he does not drink alcohol or use drugs.  ROS: UROLOGY Frequent Urination?: No Hard to postpone urination?: No Burning/pain with urination?: No Get up at night to urinate?: No Leakage of urine?: No Urine stream starts and stops?: No Trouble starting stream?: No Do you have to strain to urinate?: No Blood in urine?: No Urinary tract infection?: No Sexually transmitted disease?: No Injury to kidneys or bladder?: No Painful intercourse?: No Weak stream?: No Erection problems?: No Penile pain?: No  Gastrointestinal Nausea?: No Vomiting?: No Indigestion/heartburn?: No Diarrhea?: No Constipation?: No  Constitutional Fever: No Night sweats?: No Weight loss?: No Fatigue?: No  Skin Skin rash/lesions?: No Itching?: No  Eyes Blurred vision?: No Double vision?: No  Ears/Nose/Throat Sore throat?: No Sinus problems?: No  Hematologic/Lymphatic Swollen glands?: No Easy bruising?: No  Cardiovascular Leg swelling?: No Chest pain?: No  Respiratory Cough?: No Shortness of breath?: No  Endocrine Excessive thirst?: No  Musculoskeletal Back pain?: No Joint pain?: No  Neurological Headaches?: No Dizziness?: No  Psychologic Depression?: No Anxiety?: No  Physical  Exam: BP (!) 169/71   Pulse 82   Ht 6\' 2"  (1.88 m)   Wt 220 lb (99.8 kg)   BMI 28.25 kg/m   Constitutional:  Alert and oriented, No acute distress.  Accompanied by wife today who "translates" due to the patient's hearing deficit. HEENT: [place] AT, moist mucus membranes.  Trachea midline, no masses. Cardiovascular: No clubbing, cyanosis, or edema. Respiratory: Normal respiratory effort, no increased work of breathing. Skin: No rashes, bruises or suspicious lesions. Neurologic: Grossly intact, no focal deficits, moving all 4 extremities. Psychiatric: Normal mood and affect.  Laboratory Data: Lab Results  Component Value Date   WBC 6.8 01/04/2019   HGB 13.0 01/04/2019   HCT 39.4 01/04/2019   MCV 84.7 01/04/2019   PLT 282 01/04/2019    Lab Results  Component Value Date   CREATININE 1.43 (H) 12/10/2016    Assessment & Plan:    1. Prostate cancer [facility]) Personal history of prostate cancer status post prostatectomy followed by salvage radiation  Currently no evidence of disease Would recommend continued PSA on a every 6 month basis, lab only in 6 months and visit in 1 year  2. 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  I given him information about the device today and discussed my experience.  Overall, patients are quite satisfied with penile implant I feel like he would do well with this.  I recommended a high-volume implanter and will make the appropriate referral when he is ready.  All questions answered today.  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  Hollice Espy, MD  [facility]  [address] [place], [place] [address] [phone]

## 2019-06-08 ENCOUNTER — Telehealth: Payer: Self-pay | Admitting: Urology

## 2019-06-08 NOTE — Telephone Encounter (Signed)
Pt's wife called and states that he needs a refill for Trimex.

## 2019-06-09 MED ORDER — NONFORMULARY OR COMPOUNDED ITEM: 0 refills | Status: DC

## 2019-06-09 NOTE — Telephone Encounter (Signed)
Patient wife aware-refill sent to [facility]

## 2019-11-04 ENCOUNTER — Encounter (INDEPENDENT_AMBULATORY_CARE_PROVIDER_SITE_OTHER): Payer: Self-pay

## 2019-11-04 ENCOUNTER — Other Ambulatory Visit: Payer: Self-pay

## 2019-11-04 ENCOUNTER — Other Ambulatory Visit: Payer: Medicare Other

## 2019-11-04 DIAGNOSIS — C61 Malignant neoplasm of prostate: Secondary | ICD-10-CM

## 2019-11-05 LAB — PSA: Prostate Specific Ag, Serum: <0.1 ng/mL (ref 0.0–4.0)

## 2019-11-08 ENCOUNTER — Telehealth: Payer: Self-pay | Admitting: *Deleted

## 2019-11-08 NOTE — Telephone Encounter (Addendum)
Patient informed, voiced understanding.  ----- Message from Hollice Espy, MD sent at 11/08/2019  7:43 AM EDT ----- PSA is undetectable  Hollice Espy, MD

## 2020-02-23 ENCOUNTER — Other Ambulatory Visit: Payer: Self-pay

## 2020-02-23 MED ORDER — NONFORMULARY OR COMPOUNDED ITEM: 0 refills | Status: DC

## 2020-03-31 ENCOUNTER — Ambulatory Visit: Payer: Medicare Other | Admitting: Radiation Oncology

## 2020-04-07 ENCOUNTER — Ambulatory Visit: Payer: Medicare Other | Admitting: Radiation Oncology

## 2020-04-10 ENCOUNTER — Ambulatory Visit
Admission: RE | Admit: 2020-04-10 | Discharge: 2020-04-10 | Disposition: A | Discharge status: Home / Self Care | Payer: Medicare Other | Source: Ambulatory Visit | Attending: Radiation Oncology | Admitting: Radiation Oncology

## 2020-04-10 ENCOUNTER — Encounter: Payer: Self-pay | Admitting: Radiation Oncology

## 2020-04-10 VITALS — BP 142/67 | HR 74 | Temp 97.0°F | Wt 212.0 lb

## 2020-04-10 DIAGNOSIS — R351 Nocturia: Secondary | ICD-10-CM | POA: Insufficient documentation

## 2020-04-10 DIAGNOSIS — C61 Malignant neoplasm of prostate: Secondary | ICD-10-CM

## 2020-04-10 DIAGNOSIS — N529 Male erectile dysfunction, unspecified: Secondary | ICD-10-CM | POA: Insufficient documentation

## 2020-04-10 DIAGNOSIS — R3915 Urgency of urination: Secondary | ICD-10-CM | POA: Insufficient documentation

## 2020-04-10 DIAGNOSIS — Z923 Personal history of irradiation: Secondary | ICD-10-CM | POA: Insufficient documentation

## 2020-04-10 NOTE — Progress Notes (Signed)
Radiation Oncology Follow up Note  Name: Lee Graham   Date:   04/10/2020 MRN:  [id number] DOB: 03/03/1951    This 69 y.o. male presents to the clinic today for 3-year follow-up status post salvage radiation therapy for Gleason 9 (4+5) adenocarcinoma the prostate status post robotic assisted prostatectomy with extraprostatic extension as well as right seminal vesicle involvement and bladder neck invasion.  REFERRING PROVIDER: Albina Billet, MD  HPI: Patient is a 69 year old male now seen at 3 years having completed salvage radiation therapy for Gleason 9 (4+5) adenocarcinoma prostate with positive margins bladder neck invasion as well as right seminal vesicle involvement.  Seen today in routine follow-up he is doing well.  He did have some changes consistent with radiation cystitis of those that has resolved he does say his urine has a smoky smell to it not really sure the reasoning for that.  He does have nocturia x4 some slight urgency and frequency.  His most recent PSA is.  Less than 0.1 consistent over the past year.  Patient does have erectile dysfunction  COMPLICATIONS OF TREATMENT: none  FOLLOW UP COMPLIANCE: keeps appointments   PHYSICAL EXAM:  BP (!) 142/67   Pulse 74   Temp (!) 97 F (36.1 C) (Tympanic)   Wt 212 lb (96.2 kg)   BMI 27.22 kg/m  Well-developed well-nourished patient in NAD. HEENT reveals PERLA, EOMI, discs not visualized.  Oral cavity is clear. No oral mucosal lesions are identified. Neck is clear without evidence of cervical or supraclavicular adenopathy. Lungs are clear to A&P. Cardiac examination is essentially unremarkable with regular rate and rhythm without murmur rub or thrill. Abdomen is benign with no organomegaly or masses noted. Motor sensory and DTR levels are equal and symmetric in the upper and lower extremities. Cranial nerves II through XII are grossly intact. Proprioception is intact. No peripheral adenopathy or edema is identified. No motor or  sensory levels are noted. Crude visual fields are within normal range.  RADIOLOGY RESULTS: No current films to review  PLAN: At the present time patient is under excellent biochemical control of his prostate cancer.  I have asked to see him back in 1 year for follow-up with a PSA.  Not sure of the odor of his urine may be medication related or dietary related.  Does not seem to be a problem.  Patient and wife know to call with any concerns at any time.  I would like to take this opportunity to thank you for allowing me to participate in the care of your patient.Noreene Filbert, MD

## 2020-04-21 ENCOUNTER — Other Ambulatory Visit: Payer: Self-pay | Admitting: Family Medicine

## 2020-04-21 DIAGNOSIS — C61 Malignant neoplasm of prostate: Secondary | ICD-10-CM

## 2020-05-02 ENCOUNTER — Other Ambulatory Visit: Payer: Self-pay

## 2020-05-02 ENCOUNTER — Other Ambulatory Visit: Payer: Medicare Other

## 2020-05-02 DIAGNOSIS — C61 Malignant neoplasm of prostate: Secondary | ICD-10-CM

## 2020-05-03 LAB — PSA: Prostate Specific Ag, Serum: <0.1 ng/mL (ref 0.0–4.0)

## 2020-05-04 NOTE — Progress Notes (Signed)
05/05/2020 10:01 AM   Lee Graham 1951-02-04 [id number]  Referring provider: Albina Billet, MD [address]   [place],  [place] [address]  Chief Complaint  Patient presents with  . Prostate Cancer    HPI: 69 year old male with a personal history of high risk prostate cancer status post prostatectomy 12/2016 followed by adjuvant radiation.  Surgical pathology consistent with Gleason 3+5, focal extraprostatic extension, right seminal vesicle involvement, and microscopic bladder neck invasion all present. Surgical margins negative. Negative lymph nodes. pT2bN0 M0.   Postoperatively, his PSA was detectable at 0.2 ng/dL. He since completed salvage radiation.  He was on a short course of Lupron which was well-tolerated and has had no subsequent therapy.  More recently, he experienced gross hematuria and was found to have an erythematous patch for which he underwent biopsy on 01/11/2019 along with bilateral retrogrades.  This indicated findings consistent with radiation cystitis.  He continues to use Trimix, 1 mL as needed.  He states that sometimes the medication is effective but he has erections for 4 hours and sometimes it does not work at all.  He also states that sometimes when he receives the vials there is white things floating in them and he throws the medication out.  He also states that he is been given busted syringes at times as well.  Most recent PSA on 05/02/2020 undetectable.  PMH: Past Medical History:  Diagnosis Date  . Arthritis   . Cancer ([place]) 11/13/2016   prostate  . Deafness in left ear    ONLY 30% HEARING IN RIGHT EAR  . GERD (gastroesophageal reflux disease)    H/O  . Hyperlipidemia   . Hypertension     Surgical History: Past Surgical History:  Procedure Laterality Date  . COLONOSCOPY WITH PROPOFOL N/A 08/30/2015   Procedure: COLONOSCOPY WITH PROPOFOL;  Surgeon: Christene Lye, MD;  Location: [facility];  Service: Endoscopy;   Laterality: N/A;  . CYSTOSCOPY W/ RETROGRADES Bilateral 01/11/2019   Procedure: CYSTOSCOPY WITH RETROGRADE PYELOGRAM;  Surgeon: Hollice Espy, MD;  Location: [facility];  Service: Urology;  Laterality: Bilateral;  . CYSTOSCOPY WITH BIOPSY N/A 01/11/2019   Procedure: CYSTOSCOPY WITH Bladder BIOPSY;  Surgeon: Hollice Espy, MD;  Location: [facility];  Service: Urology;  Laterality: N/A;  . FINGER SURGERY Right 2008   with skin graft   . LIPOMA EXCISION Bilateral 03/13/2018   Procedure: EXCISION LIPOMA- Right  SHOULDER AND RIGHT BACK;  Surgeon: Robert Bellow, MD;  Location: [facility];  Service: General;  Laterality: Bilateral;  . ROBOT ASSISTED LAPAROSCOPIC RADICAL PROSTATECTOMY N/A 12/09/2016   Procedure: ROBOTIC ASSISTED LAPAROSCOPIC RADICAL PROSTATECTOMY WITH BILATERAL PELVIC NODE DISSECTION;  Surgeon: Hollice Espy, MD;  Location: [facility];  Service: Urology;  Laterality: N/A;    Home Medications:  Allergies as of 05/05/2020      Reactions   Bactrim [sulfamethoxazole-trimethoprim] Nausea And Vomiting, Other (See Comments)   hallucinations      Medication List       Accurate as of May 05, 2020 10:01 AM. If you have any questions, ask your nurse or doctor.        metoprolol succinate 50 MG 24 hr tablet Commonly known as: TOPROL-XL Take 50 mg by mouth every morning.   NONFORMULARY OR COMPOUNDED ITEM Trimix (30/1/10)-(Pap/Phent/PGE)  Dosage: Inject 1 cc per injection  Vial 54ml  Qty 1 Refills [facility] [phone] Fax [phone]   simvastatin 40 MG tablet Commonly known as: ZOCOR Take  40 mg by mouth daily at 6 PM.   telmisartan 20 MG tablet Commonly known as: MICARDIS Take 20 mg by mouth every morning.       Allergies:  Allergies  Allergen Reactions  . Bactrim [Sulfamethoxazole-Trimethoprim] Nausea And Vomiting and Other (See Comments)    hallucinations    Family History: Family History  Problem Relation Age of Onset  . Colon cancer  Father   . Prostate cancer Neg Hx   . Bladder Cancer Neg Hx   . Kidney cancer Neg Hx     Social History:  reports that he quit smoking about 49 years ago. His smoking use included cigars. He has quit using smokeless tobacco. He reports that he does not drink alcohol and does not use drugs.  ROS: For pertinent review of systems please refer to history of present illness  Physical Exam: BP 134/68 (BP Location: Left Arm, Patient Position: Sitting, Cuff Size: Large)   Pulse 76   Ht 6\' 2"  (1.88 m)   BMI 27.22 kg/m   Constitutional:  Well nourished. Alert and oriented, No acute distress. HEENT: [place] AT, mask in place.  Trachea midline Cardiovascular: No clubbing, cyanosis, or edema. Respiratory: Normal respiratory effort, no increased work of breathing. Neurologic: Grossly intact, no focal deficits, moving all 4 extremities. Psychiatric: Normal mood and affect.  Laboratory Data: Lab Results  Component Value Date   WBC 6.8 01/04/2019   HGB 13.0 01/04/2019   HCT 39.4 01/04/2019   MCV 84.7 01/04/2019   PLT 282 01/04/2019    Lab Results  Component Value Date   CREATININE 1.43 (H) 12/10/2016    Assessment & Plan:    1. Prostate cancer [facility]) Personal history of prostate cancer status post prostatectomy followed by salvage radiation Currently no evidence of disease Continued PSA on a every 6 month basis, lab only in 6 months and visit in 1 year  2. Erectile dysfunction after radical prostatectomy Continue Trimix injections  3. Radiation cystitis Biopsy-proven radiation cystitis as previous cause for hematuria Urinary symptoms are currently minimal with no further bleeding episodes We will continue to follow clinically  Return in about 6 months (around 11/03/2020) for PSA only.  Zara Council, PA-C  [facility] [address], [place],  [address] [phone]

## 2020-05-05 ENCOUNTER — Telehealth: Payer: Self-pay | Admitting: Urology

## 2020-05-05 ENCOUNTER — Other Ambulatory Visit: Payer: Self-pay

## 2020-05-05 ENCOUNTER — Ambulatory Visit (INDEPENDENT_AMBULATORY_CARE_PROVIDER_SITE_OTHER): Payer: Medicare Other | Admitting: Urology

## 2020-05-05 ENCOUNTER — Encounter: Payer: Self-pay | Admitting: Urology

## 2020-05-05 VITALS — BP 134/68 | HR 76 | Ht 74.0 in

## 2020-05-05 DIAGNOSIS — N5231 Erectile dysfunction following radical prostatectomy: Secondary | ICD-10-CM

## 2020-05-05 DIAGNOSIS — C61 Malignant neoplasm of prostate: Secondary | ICD-10-CM

## 2020-05-05 DIAGNOSIS — N3040 Irradiation cystitis without hematuria: Secondary | ICD-10-CM

## 2020-05-05 MED ORDER — NONFORMULARY OR COMPOUNDED ITEM: 3 refills | Status: DC

## 2020-05-05 NOTE — Telephone Encounter (Signed)
RX sent

## 2020-05-05 NOTE — Telephone Encounter (Signed)
He needs a refill on his Trimx sent to [facility].

## 2020-07-27 ENCOUNTER — Other Ambulatory Visit: Payer: Self-pay | Admitting: General Surgery

## 2020-07-27 DIAGNOSIS — C61 Malignant neoplasm of prostate: Secondary | ICD-10-CM

## 2020-07-27 NOTE — Progress Notes (Signed)
Subjective:     Patient ID: Lee Graham is a 70 y.o. male.  HPI  The following portions of the patient's history were reviewed and updated as appropriate.  This an established patient is here today for: office visit. He is here to discuss having a colonoscopy, last completed in 2017 by Dr Jamal Collin.  He denies any GI issues, bowels move regular.  The patient is accompanied today by his wife, Butch Penny.   The patient reports that his mother and 2 brothers had colon and/or rectal cancer.  All members of his family have had one form of cancer another.  Patient himself has had prostate cancer.  (He received post prostatectomy radiation).  Brother recently died with rectal cancer with brain metastases in [place].  Colonoscopy in 2017 was unremarkable.  Clinic notes from that time reference a prior exam in 2009 at which time polyps were identified.  Pathology for these lesions has not been located.  The patient reports no active bowel problems.  Review of Systems  Constitutional: Negative for chills and fever.  Respiratory: Negative for cough.        Chief Complaint  Patient presents with  . discuss colonoscopy     BP 130/70   Pulse 67   Temp 36.8 C (98.2 F)   Ht 188 cm (6\' 2" )   Wt (!) 102.5 kg (226 lb)   SpO2 99%   BMI 29.02 kg/m       Past Medical History:  Diagnosis Date  . Arthritis   . Deafness in left ear   . GERD (gastroesophageal reflux disease)   . Hyperlipidemia   . Hypertension   . Prostate cancer ([facility]) 11/13/2016          Past Surgical History:  Procedure Laterality Date  . COLONOSCOPY  08/30/2015   Dr. Jamal Collin  . CYSTOSCOPY  01/11/2019   with biopsy  . cystoscopy with retrogrades Bilateral 01/11/2019  . finger surgery Right 2008   with skin graft  . lipoma excision Right 03/13/2018   right shoulder & right back  . prostectomy  2017  . skin graft     Rt index finger       Social History           Socioeconomic History  . Marital status: Married    Spouse name: Not on file  . Number of children: Not on file  . Years of education: Not on file  . Highest education level: Not on file  Occupational History  . Not on file  Tobacco Use  . Smoking status: Former Smoker    Types: Cigars    Quit date: 1970    Years since quitting: 52.1  . Smokeless tobacco: Former Systems developer    Types: Chew  Substance and Sexual Activity  . Alcohol use: Not Currently  . Drug use: Not on file  . Sexual activity: Not on file  Other Topics Concern  . Not on file  Social History Narrative  . Not on file   Social Determinants of Health   Financial Resource Strain: Not on file  Food Insecurity: Not on file  Transportation Needs: Not on file           Allergies  Allergen Reactions  . Bactrim [Sulfamethoxazole-Trimethoprim] Nausea, Hallucination and Vomiting    Current Medications        Current Outpatient Medications  Medication Sig Dispense Refill  . metoprolol succinate (TOPROL-XL) 100 MG XL tablet Take by mouth 50 mg      .  simvastatin (ZOCOR) 40 MG tablet Take by mouth    . telmisartan (MICARDIS) 40 MG tablet Take 20 mg by mouth once daily 20 mg      . aspirin 81 MG EC tablet Take by mouth (Patient not taking: Reported on 07/27/2020  )     No current facility-administered medications for this visit.           Family History  Problem Relation Age of Onset  . Heart disease Mother   . High blood pressure (Hypertension) Mother   . Heart disease Father   . High blood pressure (Hypertension) Father   . Colon cancer Father         Objective:   Physical Exam Constitutional:      Appearance: Normal appearance.  Cardiovascular:     Rate and Rhythm: Normal rate and regular rhythm.     Pulses: Normal pulses.     Heart sounds: Normal heart sounds.  Pulmonary:     Effort: Pulmonary effort is normal.     Breath sounds: Normal breath sounds.   Musculoskeletal:     Cervical back: Neck supple.  Skin:    General: Skin is warm and dry.  Neurological:     Mental Status: He is alert and oriented to person, place, and time.  Psychiatric:        Behavior: Behavior normal.      Labs and Radiology:    2017 colonoscopy reviewed.   Serial PSAs show levels less than 0.1.     Assessment:     Candidate for repeat colonoscopy based on family history.  Candidate for referral to genetic counseling to determine if genetic sequencing would be of benefit.    Plan:     The patient was instructed in regards to preparation for the colonoscopy by the staff.  A referral has been placed to medical genetics for consultation.     Entered by Karie Fetch, RN, acting as a scribe for Dr. Hervey Ard, MD.  The documentation recorded by the scribe accurately reflects the service I personally performed and the decisions made by me.   Robert Bellow, MD FACS

## 2020-08-07 ENCOUNTER — Other Ambulatory Visit: Payer: Self-pay

## 2020-08-07 ENCOUNTER — Other Ambulatory Visit
Admission: RE | Admit: 2020-08-07 | Discharge: 2020-08-07 | Disposition: A | Discharge status: Home / Self Care | Payer: Medicare HMO | Source: Ambulatory Visit | Attending: General Surgery | Admitting: General Surgery

## 2020-08-07 DIAGNOSIS — Z01812 Encounter for preprocedural laboratory examination: Secondary | ICD-10-CM | POA: Diagnosis present

## 2020-08-07 DIAGNOSIS — Z20822 Contact with and (suspected) exposure to covid-19: Secondary | ICD-10-CM | POA: Diagnosis not present

## 2020-08-08 LAB — SARS CORONAVIRUS 2 (TAT 6-24 HRS): SARS Coronavirus 2: NEGATIVE

## 2020-08-09 ENCOUNTER — Ambulatory Visit
Admission: RE | Admit: 2020-08-09 | Discharge: 2020-08-09 | Disposition: A | Discharge status: Home / Self Care | Payer: Medicare HMO | Source: Ambulatory Visit | Attending: General Surgery | Admitting: General Surgery

## 2020-08-09 ENCOUNTER — Ambulatory Visit: Payer: Medicare HMO | Admitting: Certified Registered Nurse Anesthetist

## 2020-08-09 ENCOUNTER — Encounter
Admission: RE | Disposition: A | Discharge status: Home / Self Care | Payer: Self-pay | Source: Ambulatory Visit | Attending: General Surgery

## 2020-08-09 DIAGNOSIS — Z1211 Encounter for screening for malignant neoplasm of colon: Secondary | ICD-10-CM | POA: Diagnosis present

## 2020-08-09 DIAGNOSIS — Z87891 Personal history of nicotine dependence: Secondary | ICD-10-CM | POA: Insufficient documentation

## 2020-08-09 DIAGNOSIS — I252 Old myocardial infarction: Secondary | ICD-10-CM | POA: Diagnosis not present

## 2020-08-09 DIAGNOSIS — Z8371 Family history of colonic polyps: Secondary | ICD-10-CM | POA: Diagnosis not present

## 2020-08-09 DIAGNOSIS — Z800 Family history of malignant neoplasm of digestive organs: Secondary | ICD-10-CM | POA: Diagnosis not present

## 2020-08-09 DIAGNOSIS — I10 Essential (primary) hypertension: Secondary | ICD-10-CM | POA: Diagnosis not present

## 2020-08-09 DIAGNOSIS — Z79899 Other long term (current) drug therapy: Secondary | ICD-10-CM | POA: Insufficient documentation

## 2020-08-09 DIAGNOSIS — D123 Benign neoplasm of transverse colon: Secondary | ICD-10-CM | POA: Diagnosis not present

## 2020-08-09 DIAGNOSIS — Z8546 Personal history of malignant neoplasm of prostate: Secondary | ICD-10-CM | POA: Insufficient documentation

## 2020-08-09 DIAGNOSIS — Z7982 Long term (current) use of aspirin: Secondary | ICD-10-CM | POA: Insufficient documentation

## 2020-08-09 DIAGNOSIS — Z881 Allergy status to other antibiotic agents status: Secondary | ICD-10-CM | POA: Insufficient documentation

## 2020-08-09 DIAGNOSIS — D127 Benign neoplasm of rectosigmoid junction: Secondary | ICD-10-CM | POA: Diagnosis not present

## 2020-08-09 HISTORY — PX: COLONOSCOPY WITH PROPOFOL: SHX5780

## 2020-08-09 SURGERY — COLONOSCOPY WITH PROPOFOL
Anesthesia: General

## 2020-08-09 MED ORDER — SODIUM CHLORIDE 0.9 % IV SOLN
INTRAVENOUS | Status: DC
  Administered 2020-08-09: 20 mL/h via INTRAVENOUS

## 2020-08-09 MED ORDER — LIDOCAINE HCL (CARDIAC) PF 100 MG/5ML IV SOSY
PREFILLED_SYRINGE | INTRAVENOUS | Status: DC | PRN
  Administered 2020-08-09: 50 mg via INTRAVENOUS

## 2020-08-09 MED ORDER — PROPOFOL 500 MG/50ML IV EMUL
INTRAVENOUS | Status: AC
  Filled 2020-08-09: qty 50

## 2020-08-09 MED ORDER — PHENYLEPHRINE HCL (PRESSORS) 10 MG/ML IV SOLN
INTRAVENOUS | Status: DC | PRN
  Administered 2020-08-09 (×2): 100 ug via INTRAVENOUS

## 2020-08-09 MED ORDER — PROPOFOL 500 MG/50ML IV EMUL
INTRAVENOUS | Status: DC | PRN
  Administered 2020-08-09: 150 ug/kg/min via INTRAVENOUS

## 2020-08-09 MED ORDER — PROPOFOL 10 MG/ML IV BOLUS
INTRAVENOUS | Status: DC | PRN
  Administered 2020-08-09: 30 mg via INTRAVENOUS
  Administered 2020-08-09: 70 mg via INTRAVENOUS

## 2020-08-09 NOTE — Anesthesia Procedure Notes (Signed)
Date/Time: 08/09/2020 9:00 AM Performed by: Johnna Acosta, CRNA Pre-anesthesia Checklist: Emergency Drugs available, Patient identified, Suction available, Patient being monitored and Timeout performed Patient Re-evaluated:Patient Re-evaluated prior to induction Oxygen Delivery Method: Nasal cannula Preoxygenation: Pre-oxygenation with 100% oxygen Induction Type: IV induction

## 2020-08-09 NOTE — Op Note (Signed)
[facility] Gastroenterology Patient Name: Lee Graham Procedure Date: 08/09/2020 9:03 AM MRN: [id number] Account #: [id number] Date of Birth: Jan 24, 1951 Admit Type: Outpatient Age: 70 Room: [facility] ENDO ROOM 1 Gender: Male Note Status: Finalized Procedure:             Colonoscopy Indications:           Colon cancer screening in patient at increased risk:                         Family history of colon polyps in multiple 1st-degree                         relatives Providers:             Robert Bellow, MD Referring MD:          Leona Carry. Hall Busing, MD (Referring MD) Medicines:             Monitored Anesthesia Care Complications:         No immediate complications. Procedure:             Pre-Anesthesia Assessment:                        - Prior to the procedure, a History and Physical was                         performed, and patient medications, allergies and                         sensitivities were reviewed. The patient's tolerance                         of previous anesthesia was reviewed.                        - The risks and benefits of the procedure and the                         sedation options and risks were discussed with the                         patient. All questions were answered and informed                         consent was obtained.                        After obtaining informed consent, the colonoscope was                         passed under direct vision. Throughout the procedure,                         the patient's blood pressure, pulse, and oxygen                         saturations were monitored continuously. The                         Colonoscope was introduced through the  anus and                         advanced to the the cecum, identified by appendiceal                         orifice and ileocecal valve. The colonoscopy was                         performed without difficulty. The patient tolerated                          the procedure well. The quality of the bowel                         preparation was excellent. Findings:      Three sessile polyps were found in the recto-sigmoid colon, sigmoid       colon and transverse colon. The polyps were 5 mm in size. These were       biopsied with a cold forceps for histology.      The retroflexed view of the distal rectum and anal verge was normal and       showed no anal or rectal abnormalities. Impression:            - Three 5 mm polyps at the recto-sigmoid colon, in the                         sigmoid colon and in the transverse colon. Biopsied.                        - The distal rectum and anal verge are normal on                         retroflexion view. Recommendation:        - Telephone endoscopist for pathology results in 1                         week. Procedure Code(s):     --- Professional ---                        [phone], Colonoscopy, flexible; with biopsy, single or                         multiple Diagnosis Code(s):     --- Professional ---                        Z83.71, Family history of colonic polyps                        K63.5, Polyp of colon CPT copyright 2019 American Medical Association. All rights reserved. The codes documented in this report are preliminary and upon coder review may  be revised to meet current compliance requirements. Robert Bellow, MD 08/09/2020 9:36:10 AM This report has been signed electronically. Number of Addenda: 0 Note Initiated On: 08/09/2020 9:03 AM Scope Withdrawal Time: 0 hours 20 minutes 23 seconds  Total Procedure Duration: 0 hours 23 minutes 32 seconds  Estimated Blood Loss:  Estimated blood loss: none.  [facility]

## 2020-08-09 NOTE — H&P (Signed)
KIRKE BREACH [id number] April 06, 1951     HPI:  Healthy 70 y/o male with strong family history of colon cancer.  For screening exam.   Medications Prior to Admission  Medication Sig Dispense Refill Last Dose  . aspirin EC 81 MG tablet Take 81 mg by mouth daily. Swallow whole.   Past Month at Unknown time  . metoprolol succinate (TOPROL-XL) 50 MG 24 hr tablet Take 50 mg by mouth every morning.    08/09/2020 at 0500  . NONFORMULARY OR COMPOUNDED ITEM Trimix (30/1/10)-(Pap/Phent/PGE)  Dosage: Inject 1 cc per injection  Vial 33ml  Qty 1 Refills 3  [place] [phone] Fax [phone] 1 each 3 08/08/2020 at Unknown time  . simvastatin (ZOCOR) 40 MG tablet Take 40 mg by mouth daily at 6 PM.    08/08/2020 at Unknown time  . telmisartan (MICARDIS) 20 MG tablet Take 20 mg by mouth every morning.    08/09/2020 at 0500   Allergies  Allergen Reactions  . Bactrim [Sulfamethoxazole-Trimethoprim] Nausea And Vomiting and Other (See Comments)    hallucinations   Past Medical History:  Diagnosis Date  . Arthritis   . Cancer ([place]) 11/13/2016   prostate  . Deafness in left ear    ONLY 30% HEARING IN RIGHT EAR  . GERD (gastroesophageal reflux disease)    H/O  . Hyperlipidemia   . Hypertension    Past Surgical History:  Procedure Laterality Date  . COLONOSCOPY WITH PROPOFOL N/A 08/30/2015   Procedure: COLONOSCOPY WITH PROPOFOL;  Surgeon: Christene Lye, MD;  Location: [facility] ENDOSCOPY;  Service: Endoscopy;  Laterality: N/A;  . CYSTOSCOPY W/ RETROGRADES Bilateral 01/11/2019   Procedure: CYSTOSCOPY WITH RETROGRADE PYELOGRAM;  Surgeon: Hollice Espy, MD;  Location: [facility] ORS;  Service: Urology;  Laterality: Bilateral;  . CYSTOSCOPY WITH BIOPSY N/A 01/11/2019   Procedure: CYSTOSCOPY WITH Bladder BIOPSY;  Surgeon: Hollice Espy, MD;  Location: [facility] ORS;  Service: Urology;  Laterality: N/A;  . FINGER SURGERY Right 2008   with skin graft   . LIPOMA EXCISION Bilateral 03/13/2018    Procedure: EXCISION LIPOMA- Right  SHOULDER AND RIGHT BACK;  Surgeon: Robert Bellow, MD;  Location: [facility] ORS;  Service: General;  Laterality: Bilateral;  . ROBOT ASSISTED LAPAROSCOPIC RADICAL PROSTATECTOMY N/A 12/09/2016   Procedure: ROBOTIC ASSISTED LAPAROSCOPIC RADICAL PROSTATECTOMY WITH BILATERAL PELVIC NODE DISSECTION;  Surgeon: Hollice Espy, MD;  Location: [facility] ORS;  Service: Urology;  Laterality: N/A;   Social History   Socioeconomic History  . Marital status: Married    Spouse name: Not on file  . Number of children: Not on file  . Years of education: Not on file  . Highest education level: Not on file  Occupational History  . Not on file  Tobacco Use  . Smoking status: Former Smoker    Types: Cigars    Quit date: 09/29/1970    Years since quitting: 49.8  . Smokeless tobacco: Former Network engineer  . Vaping Use: Never used  Substance and Sexual Activity  . Alcohol use: No    Alcohol/week: 0.0 standard drinks  . Drug use: No  . Sexual activity: Yes    Birth control/protection: None  Other Topics Concern  . Not on file  Social History Narrative  . Not on file   Social Determinants of Health   Financial Resource Strain: Not on file  Food Insecurity: Not on file  Transportation Needs: Not on file  Physical Activity: Not on file  Stress: Not on file  Social Connections: Not on file  Intimate Partner Violence: Not on file   Social History   Social History Narrative  . Not on file     ROS: Negative.     PE: HEENT: Negative. Lungs: Clear. Cardio: RR.  Assessment/Plan:  Proceed with planned endoscopy.   [name] [facility] 08/09/2020

## 2020-08-09 NOTE — Anesthesia Preprocedure Evaluation (Signed)
Anesthesia Evaluation  Patient identified by MRN, date of birth, ID band Patient awake    Reviewed: Allergy & Precautions, NPO status , Patient's Chart, lab work & pertinent test results  History of Anesthesia Complications Negative for: history of anesthetic complications  Airway Mallampati: II  TM Distance: >3 FB Neck ROM: Full    Dental  (+) Poor Dentition   Pulmonary neg sleep apnea, neg COPD, former smoker,    breath sounds clear to auscultation- rhonchi (-) wheezing      Cardiovascular hypertension, Pt. on medications + CAD and + Past MI  (-) Cardiac Stents and (-) CABG  Rhythm:Regular Rate:Normal - Systolic murmurs and - Diastolic murmurs    Neuro/Psych neg Seizures negative neurological ROS  negative psych ROS   GI/Hepatic Neg liver ROS, GERD  ,  Endo/Other  negative endocrine ROSneg diabetes  Renal/GU negative Renal ROS     Musculoskeletal  (+) Arthritis ,   Abdominal (+) - obese,   Peds  Hematology negative hematology ROS (+)   Anesthesia Other Findings Past Medical History: No date: Arthritis 11/13/2016: Cancer ([place])     Comment:  prostate No date: Deafness in left ear     Comment:  ONLY 30% HEARING IN RIGHT EAR No date: GERD (gastroesophageal reflux disease)     Comment:  H/O No date: Hyperlipidemia No date: Hypertension   Reproductive/Obstetrics                             Anesthesia Physical Anesthesia Plan  ASA: III  Anesthesia Plan: General   Post-op Pain Management:    Induction: Intravenous  PONV Risk Score and Plan: 1 and Propofol infusion  Airway Management Planned: Natural Airway  Additional Equipment:   Intra-op Plan:   Post-operative Plan:   Informed Consent: I have reviewed the patients History and Physical, chart, labs and discussed the procedure including the risks, benefits and alternatives for the proposed anesthesia with the patient or  authorized representative who has indicated his/her understanding and acceptance.     Dental advisory given  Plan Discussed with: CRNA and Anesthesiologist  Anesthesia Plan Comments:         Anesthesia Quick Evaluation

## 2020-08-09 NOTE — Anesthesia Postprocedure Evaluation (Signed)
Anesthesia Post Note  Patient: Lee Graham  Procedure(s) Performed: COLONOSCOPY WITH PROPOFOL (N/A )  Patient location during evaluation: Endoscopy Anesthesia Type: General Level of consciousness: awake and alert and oriented Pain management: pain level controlled Vital Signs Assessment: post-procedure vital signs reviewed and stable Respiratory status: spontaneous breathing, nonlabored ventilation and respiratory function stable Cardiovascular status: blood pressure returned to baseline and stable Postop Assessment: no signs of nausea or vomiting Anesthetic complications: no   No complications documented.   Last Vitals:  Vitals:   08/09/20 0957 08/09/20 1007  BP: 118/61 113/69  Pulse:    Resp:    Temp:    SpO2:      Last Pain:  Vitals:   08/09/20 1007  TempSrc:   PainSc: 0-No pain                 Adam Demary

## 2020-08-09 NOTE — Transfer of Care (Signed)
Immediate Anesthesia Transfer of Care Note  Patient: Lee Graham  Procedure(s) Performed: COLONOSCOPY WITH PROPOFOL (N/A )  Patient Location: PACU  Anesthesia Type:General  Level of Consciousness: sedated  Airway & Oxygen Therapy: Patient Spontanous Breathing  Post-op Assessment: Report given to RN and Post -op Vital signs reviewed and stable  Post vital signs: Reviewed and stable  Last Vitals:  Vitals Value Taken Time  BP 94/60 08/09/20 0941  Temp 37 C 08/09/20 0937  Pulse 65 08/09/20 0941  Resp 16 08/09/20 0941  SpO2 97 % 08/09/20 0941  Vitals shown include unvalidated device data.  Last Pain:  Vitals:   08/09/20 0937  TempSrc: Temporal  PainSc: Asleep         Complications: No complications documented.

## 2020-08-10 ENCOUNTER — Encounter: Payer: Self-pay | Admitting: General Surgery

## 2020-08-10 LAB — SURGICAL PATHOLOGY

## 2020-08-16 ENCOUNTER — Inpatient Hospital Stay: Payer: Medicare HMO

## 2020-08-16 ENCOUNTER — Encounter: Payer: Self-pay | Admitting: Licensed Clinical Social Worker

## 2020-08-16 ENCOUNTER — Inpatient Hospital Stay: Payer: Medicare HMO | Attending: Oncology | Admitting: Licensed Clinical Social Worker

## 2020-08-16 DIAGNOSIS — Z8042 Family history of malignant neoplasm of prostate: Secondary | ICD-10-CM

## 2020-08-16 DIAGNOSIS — C61 Malignant neoplasm of prostate: Secondary | ICD-10-CM | POA: Diagnosis not present

## 2020-08-16 DIAGNOSIS — Z808 Family history of malignant neoplasm of other organs or systems: Secondary | ICD-10-CM | POA: Diagnosis not present

## 2020-08-16 DIAGNOSIS — Z800 Family history of malignant neoplasm of digestive organs: Secondary | ICD-10-CM | POA: Diagnosis not present

## 2020-08-16 DIAGNOSIS — Z801 Family history of malignant neoplasm of trachea, bronchus and lung: Secondary | ICD-10-CM

## 2020-08-16 NOTE — Progress Notes (Signed)
REFERRING PROVIDER: Robert Bellow, MD [place],  [place] [address]  PRIMARY PROVIDER:  Albina Billet, MD  PRIMARY REASON FOR VISIT:  1. Prostate cancer ([place])   2. Family history of prostate cancer   3. Family history of brain cancer   4. Family history of colon cancer   5. Family history of pancreatic cancer   6. Family history of lung cancer      HISTORY OF PRESENT ILLNESS:   Lee Graham, a 70 y.o. male, was seen for a [place] cancer genetics consultation at the request of Dr. Bary Castilla due to a personal and family history of cancer.  Lee Graham presents to clinic today to discuss the possibility of a hereditary predisposition to cancer, genetic testing, and to further clarify his future cancer risks, as well as potential cancer risks for family members.   At the age of 37, Lee Graham was diagnosed with prostate cancer. This was treated with prostatectomy and radiation. He had a colonoscopy in 2022 that revealed 3 tubular adenomas; no other reported polyp history.  CANCER HISTORY:  Oncology History   No history exists.    Past Medical History:  Diagnosis Date  . Arthritis   . Cancer ([place]) 11/13/2016   prostate  . Deafness in left ear    ONLY 30% HEARING IN RIGHT EAR  . Family history of brain cancer   . Family history of colon cancer   . Family history of lung cancer   . Family history of pancreatic cancer   . Family history of prostate cancer   . GERD (gastroesophageal reflux disease)    H/O  . Hyperlipidemia   . Hypertension     Past Surgical History:  Procedure Laterality Date  . COLONOSCOPY WITH PROPOFOL N/A 08/30/2015   Procedure: COLONOSCOPY WITH PROPOFOL;  Surgeon: Christene Lye, MD;  Location: [facility] ENDOSCOPY;  Service: Endoscopy;  Laterality: N/A;  . COLONOSCOPY WITH PROPOFOL N/A 08/09/2020   Procedure: COLONOSCOPY WITH PROPOFOL;  Surgeon: Robert Bellow, MD;  Location: [facility] ENDOSCOPY;  Service: Endoscopy;  Laterality: N/A;   . CYSTOSCOPY W/ RETROGRADES Bilateral 01/11/2019   Procedure: CYSTOSCOPY WITH RETROGRADE PYELOGRAM;  Surgeon: Hollice Espy, MD;  Location: [facility] ORS;  Service: Urology;  Laterality: Bilateral;  . CYSTOSCOPY WITH BIOPSY N/A 01/11/2019   Procedure: CYSTOSCOPY WITH Bladder BIOPSY;  Surgeon: Hollice Espy, MD;  Location: [facility] ORS;  Service: Urology;  Laterality: N/A;  . FINGER SURGERY Right 2008   with skin graft   . LIPOMA EXCISION Bilateral 03/13/2018   Procedure: EXCISION LIPOMA- Right  SHOULDER AND RIGHT BACK;  Surgeon: Robert Bellow, MD;  Location: [facility] ORS;  Service: General;  Laterality: Bilateral;  . ROBOT ASSISTED LAPAROSCOPIC RADICAL PROSTATECTOMY N/A 12/09/2016   Procedure: ROBOTIC ASSISTED LAPAROSCOPIC RADICAL PROSTATECTOMY WITH BILATERAL PELVIC NODE DISSECTION;  Surgeon: Hollice Espy, MD;  Location: [facility] ORS;  Service: Urology;  Laterality: N/A;    Social History   Socioeconomic History  . Marital status: Married    Spouse name: Not on file  . Number of children: Not on file  . Years of education: Not on file  . Highest education level: Not on file  Occupational History  . Not on file  Tobacco Use  . Smoking status: Former Smoker    Types: Cigars    Quit date: 09/29/1970    Years since quitting: 49.9  . Smokeless tobacco: Former Network engineer  . Vaping Use: Never used  Substance and Sexual  Activity  . Alcohol use: No    Alcohol/week: 0.0 standard drinks  . Drug use: No  . Sexual activity: Yes    Birth control/protection: None  Other Topics Concern  . Not on file  Social History Narrative  . Not on file   Social Determinants of Health   Financial Resource Strain: Not on file  Food Insecurity: Not on file  Transportation Needs: Not on file  Physical Activity: Not on file  Stress: Not on file  Social Connections: Not on file     FAMILY HISTORY:  We obtained a detailed, 4-generation family history.  Significant diagnoses are listed below: Family  History  Problem Relation Age of Onset  . Colon cancer Father        dx 2s  . Prostate cancer Father        dx 76s  . Lung cancer Father   . Pancreatic cancer Mother   . Colon cancer Mother   . Lung cancer Mother   . Brain cancer Brother   . Colon cancer Brother   . Prostate cancer Brother   . Colon polyps Sister   . Prostate cancer Brother 69  . Colon cancer Brother 36  . Pancreatic cancer Brother   . Liver cancer Brother   . Brain cancer Brother   . Pancreatic cancer Brother 33  . Brain cancer Brother   . Cancer Brother        d. 74s  . Pancreatic cancer Brother   . Cancer Sister        possible male?  . Cancer Maternal Aunt        unk types  . Cancer Maternal Uncle        unk types  . Cancer Paternal Aunt        unk types  . Cancer Paternal Uncle        unk type  . Cancer Maternal Grandmother        unk type  . Pancreatic cancer Nephew 60  . Cancer Nephew   . Cancer Nephew   . Cancer Nephew   . Bladder Cancer Neg Hx   . Kidney cancer Neg Hx    Lee Graham has 4 sons, 1 daughter, no cancers. He had 17 siblings. 1 brother had brain and pancreatic cancer and died at 83, another brother had pancreatic cancer, and another brother had pancreatic, colon, liver and brain cancer and passed away. A brother had brain prostate and colon cancer, another brother had prostate cancer, and another brother died in his 55s and had unknown type of cancer. A sister has had lots of colon polyps, and another sister died of cancer, possibly male-related. He has 4 nephews that have died of cancer, unknown types except for 1 he knows was pancreatic.   Lee Graham mother had pancreatic, colon and lung cancer diagnosed in her 29s and died at 65. She had 2 brothers and 3 sisters, all died from cancer, unknown types. Maternal grandmother also died from cancer, unknown type.  Lee Graham father had colon cancer at 66, prostate cancer at 57 and died at 29. He also had lung cancer. He had a  brother and 3 sisters who all died from cancers, unknown types.   Lee Graham is unaware of previous family history of genetic testing for hereditary cancer risks. Patient's maternal ancestors are of [place] descent, and paternal ancestors are of [place] descent. There is no reported Ashkenazi Jewish ancestry. There is no known consanguinity.  GENETIC COUNSELING ASSESSMENT: Lee Graham is a 70 y.o. male with a personal and family history of cancer which is somewhat suggestive of a hereditary cancer syndrome and predisposition to cancer. We, therefore, discussed and recommended the following at today's visit.   DISCUSSION: We discussed that approximately 5-10% of cancer is hereditary. Given the strong family history of pancreatic, colon and prostate cancer we discussed Lynch syndrome which can increase the risk for all these cancers. There are other genes associated with these cancers as well that we can look at.  We discussed that testing is beneficial for several reasons including  knowing about other cancer risks, identifying potential screening and risk-reduction options that may be appropriate, and to understand if other family members could be at risk for cancer and allow them to undergo genetic testing.   We reviewed the characteristics, features and inheritance patterns of hereditary cancer syndromes. We also discussed genetic testing, including the appropriate family members to test, the process of testing, insurance coverage and turn-around-time for results. We discussed the implications of a negative, positive and/or variant of uncertain significant result. We recommended Lee Graham pursue genetic testing for the [facility] CancerNext-Expanded+RNA gene panel.   The CancerNext-Expanded + RNAinsight gene panel offered by [facility] and includes sequencing and rearrangement analysis for the following 77 genes: IP, ALK, APC*, ATM*, AXIN2, BAP1, BARD1, BLM, BMPR1A, BRCA1*, BRCA2*, BRIP1*, CDC73,  CDH1*,CDK4, CDKN1B, CDKN2A, CHEK2*, CTNNA1, DICER1, FANCC, FH, FLCN, GALNT12, KIF1B, LZTR1, MAX, MEN1, MET, MLH1*, MSH2*, MSH3, MSH6*, MUTYH*, NBN, NF1*, NF2, NTHL1, PALB2*, PHOX2B, PMS2*, POT1, PRKAR1A, PTCH1, PTEN*, RAD51C*, RAD51D*,RB1, RECQL, RET, SDHA, SDHAF2, SDHB, SDHC, SDHD, SMAD4, SMARCA4, SMARCB1, SMARCE1, STK11, SUFU, TMEM127, TP53*,TSC1, TSC2, VHL and XRCC2 (sequencing and deletion/duplication); EGFR, EGLN1, HOXB13, KIT, MITF, PDGFRA, POLD1 and POLE (sequencing only); EPCAM and GREM1 (deletion/duplication only).  Based on Lee Graham personal and  family history of cancer, he meets medical criteria for genetic testing. Despite that he meets criteria, he may still have an out of pocket cost. We discussed that if his out of pocket cost for testing is over $100, the laboratory will call and confirm whether he wants to proceed with testing.  If the out of pocket cost of testing is less than $100 he will be billed by the genetic testing laboratory.   PLAN: After considering the risks, benefits, and limitations, Lee Graham provided informed consent to pursue genetic testing and the blood sample was sent to [facility] for analysis of the CancerNext-Expanded+RNA panel. Results should be available within approximately 2-3 weeks' time, at which point they will be disclosed by telephone to Lee Graham, as will any additional recommendations warranted by these results. Lee Graham will receive a summary of his genetic counseling visit and a copy of his results once available. This information will also be available in Epic.   Lee Graham questions were answered to his satisfaction today. Our contact information was provided should additional questions or concerns arise. Thank you for the referral and allowing [place] to share in the care of your patient.   Faith Rogue, MS, [facility] Genetic Counselor [email or web address] Phone: [phone]  The patient was seen for a total of 40 minutes  in face-to-face genetic counseling.  [employer] student Olivia Mackie was present and assisted with family history. Patient's wife [name] was also present. Dr. Grayland Ormond was available for discussion regarding this case.   _______________________________________________________________________ For Office Staff:  Number of people involved in session: 3 Was an Intern/ student involved with case: yes

## 2020-09-04 ENCOUNTER — Telehealth: Payer: Self-pay | Admitting: Licensed Clinical Social Worker

## 2020-09-12 ENCOUNTER — Telehealth: Payer: Self-pay | Admitting: Radiation Oncology

## 2020-09-12 NOTE — Telephone Encounter (Signed)
Returned call to wife (per Ranelle Oyster). She stated she missed a phone call. Made Faith Rogue aware--as she was the last call placed to patient pre documentation.

## 2020-09-13 ENCOUNTER — Encounter: Payer: Self-pay | Admitting: Licensed Clinical Social Worker

## 2020-09-13 NOTE — Telephone Encounter (Signed)
Attempted to reach Lee Graham with genetic test results x3, unable to leave a voicemail for him/his wife. Will send a letter with contact information requesting a call to review genetic test results.

## 2020-09-25 ENCOUNTER — Encounter: Payer: Self-pay | Admitting: Licensed Clinical Social Worker

## 2020-09-25 ENCOUNTER — Telehealth: Payer: Self-pay | Admitting: Licensed Clinical Social Worker

## 2020-09-25 ENCOUNTER — Ambulatory Visit: Payer: Self-pay | Admitting: Licensed Clinical Social Worker

## 2020-09-25 DIAGNOSIS — Z1379 Encounter for other screening for genetic and chromosomal anomalies: Secondary | ICD-10-CM | POA: Insufficient documentation

## 2020-09-25 DIAGNOSIS — Z801 Family history of malignant neoplasm of trachea, bronchus and lung: Secondary | ICD-10-CM

## 2020-09-25 DIAGNOSIS — Z8042 Family history of malignant neoplasm of prostate: Secondary | ICD-10-CM

## 2020-09-25 DIAGNOSIS — Z800 Family history of malignant neoplasm of digestive organs: Secondary | ICD-10-CM

## 2020-09-25 DIAGNOSIS — C61 Malignant neoplasm of prostate: Secondary | ICD-10-CM

## 2020-09-25 DIAGNOSIS — Z808 Family history of malignant neoplasm of other organs or systems: Secondary | ICD-10-CM

## 2020-09-25 NOTE — Progress Notes (Signed)
HPI:  Mr. Postiglione was previously seen in the [facility] clinic due to a personal and family history of cancer and concerns regarding a hereditary predisposition to cancer. Please refer to our prior cancer genetics clinic note for more information regarding our discussion, assessment and recommendations, at the time. Mr. Luka recent genetic test results were disclosed to him, as were recommendations warranted by these results. These results and recommendations are discussed in more detail below.  CANCER HISTORY:  Oncology History   No history exists.    FAMILY HISTORY:  We obtained a detailed, 4-generation family history.  Significant diagnoses are listed below: Family History  Problem Relation Age of Onset  . Colon cancer Father        dx 77s  . Prostate cancer Father        dx 7s  . Lung cancer Father   . Pancreatic cancer Mother   . Colon cancer Mother   . Lung cancer Mother   . Brain cancer Brother   . Colon cancer Brother   . Prostate cancer Brother   . Colon polyps Sister   . Prostate cancer Brother 37  . Colon cancer Brother 4  . Pancreatic cancer Brother   . Liver cancer Brother   . Brain cancer Brother   . Pancreatic cancer Brother 52  . Brain cancer Brother   . Cancer Brother        d. 73s  . Pancreatic cancer Brother   . Cancer Sister        possible male?  . Cancer Maternal Aunt        unk types  . Cancer Maternal Uncle        unk types  . Cancer Paternal Aunt        unk types  . Cancer Paternal Uncle        unk type  . Cancer Maternal Grandmother        unk type  . Pancreatic cancer Nephew 60  . Cancer Nephew   . Cancer Nephew   . Cancer Nephew   . Bladder Cancer Neg Hx   . Kidney cancer Neg Hx    Mr. Moroney has 4 sons, 1 daughter, no cancers. He had 17 siblings. 1 brother had brain and pancreatic cancer and died at 3, another brother had pancreatic cancer, and another brother had pancreatic, colon, liver and brain cancer and  passed away. A brother had brain prostate and colon cancer, another brother had prostate cancer, and another brother died in his 57s and had unknown type of cancer. A sister has had lots of colon polyps, and another sister died of cancer, possibly male-related. He has 4 nephews that have died of cancer, unknown types except for 1 he knows was pancreatic.   Mr. Wedge mother had pancreatic, colon and lung cancer diagnosed in her 53s and died at 12. She had 2 brothers and 3 sisters, all died from cancer, unknown types. Maternal grandmother also died from cancer, unknown type.  Mr. Requena father had colon cancer at 56, prostate cancer at 79 and died at 56. He also had lung cancer. He had a brother and 3 sisters who all died from cancers, unknown types.   Mr. Peak is unaware of previous family history of genetic testing for hereditary cancer risks. Patient's maternal ancestors are of [place] descent, and paternal ancestors are of [place] descent. There is no reported Ashkenazi Jewish ancestry. There is no known consanguinity.    GENETIC  TEST RESULTS: Genetic testing reported out on 08/31/2020 through the [facility] CancerNext-Expanded+RNA cancer panel found no pathogenic mutations.   The CancerNext-Expanded + RNAinsight gene panel offered by [employer] and includes sequencing and rearrangement analysis for the following 77 genes: IP, ALK, APC*, ATM*, AXIN2, BAP1, BARD1, BLM, BMPR1A, BRCA1*, BRCA2*, BRIP1*, CDC73, CDH1*,CDK4, CDKN1B, CDKN2A, CHEK2*, CTNNA1, DICER1, FANCC, FH, FLCN, GALNT12, KIF1B, LZTR1, MAX, MEN1, MET, MLH1*, MSH2*, MSH3, MSH6*, MUTYH*, NBN, NF1*, NF2, NTHL1, PALB2*, PHOX2B, PMS2*, POT1, PRKAR1A, PTCH1, PTEN*, RAD51C*, RAD51D*,RB1, RECQL, RET, SDHA, SDHAF2, SDHB, SDHC, SDHD, SMAD4, SMARCA4, SMARCB1, SMARCE1, STK11, SUFU, TMEM127, TP53*,TSC1, TSC2, VHL and XRCC2 (sequencing and deletion/duplication); EGFR, EGLN1, HOXB13, KIT, MITF, PDGFRA, POLD1 and POLE (sequencing only); EPCAM  and GREM1 (deletion/duplication only).   The test report has been scanned into EPIC and is located under the Molecular Pathology section of the Results Review tab.  A portion of the result report is included below for reference.     We discussed with Mr. Aldana that because current genetic testing is not perfect, it is possible there may be a gene mutation in one of these genes that current testing cannot detect, but that chance is small.  We also discussed, that there could be another gene that has not yet been discovered, or that we have not yet tested, that is responsible for the cancer diagnoses in the family. It is also possible there is a hereditary cause for the cancer in the family that Mr. Jayson did not inherit and therefore was not identified in his testing.  Therefore, it is important to remain in touch with cancer genetics in the future so that we can continue to offer Mr. Dahlen the most up to date genetic testing.   ADDITIONAL GENETIC TESTING: We discussed with Mr. Hopes that his genetic testing was fairly extensive.  If there are genes identified to increase cancer risk that can be analyzed in the future, we would be happy to discuss and coordinate this testing at that time.    CANCER SCREENING RECOMMENDATIONS: Mr. Ballengee test result is considered negative (normal).  This means that we have not identified a hereditary cause for his  personal and family history of cancer at this time. Most cancers happen by chance and this negative test suggests that his cancer may fall into this category.    While reassuring, this does not definitively rule out a hereditary predisposition to cancer. It is still possible that there could be genetic mutations that are undetectable by current technology. There could be genetic mutations in genes that have not been tested or identified to increase cancer risk.  Therefore, it is recommended he continue to follow the cancer management and screening  guidelines provided by his primary healthcare provider.   An individual's cancer risk and medical management are not determined by genetic test results alone. Overall cancer risk assessment incorporates additional factors, including personal medical history, family history, and any available genetic information that may result in a personalized plan for cancer prevention and surveillance.  Given Mr. Brunkhorst personal and family histories, we must interpret these negative results with some caution.  Families with features suggestive of hereditary risk for cancer tend to have multiple family members with cancer, diagnoses in multiple generations and diagnoses before the age of 31. Mr. Girardin family exhibits some of these features. Thus, this result may simply reflect our current inability to detect all mutations within these genes or there may be a different gene that has not yet been discovered or  tested.   Additionally, Mr. Geister would meet criteria for pancreatic cancer screening. Mrs. Seelbach says they would like to think about this. Should Mr. Isadore decide he would like pancreatic screening, we can refer him to [facility] in [place] to discuss this with a provider there.   RECOMMENDATIONS FOR FAMILY MEMBERS:  Relatives in this family might be at some increased risk of developing cancer, over the general population risk, simply due to the family history of cancer.  We recommended male relatives in this family have a yearly mammogram beginning at age 18, or 38 years younger than the earliest onset of cancer, an annual clinical breast exam, and perform monthly breast self-exams. Male relatives in this family should also have a gynecological exam as recommended by their primary provider.  All family members should be referred for colonoscopy starting at age 26.   It is also possible there is a hereditary cause for the cancer in Mr. Xiang family that he did not inherit and therefore was not  identified in him.  Based on Mr. Hulsebus family history, we recommended anyone in his family who has had cancer, especially pancreatic or those related to who has had pancreatic like his nieces/nephews, have genetic counseling and testing. Mr. Magnan will let us know if we can be of any assistance in coordinating genetic counseling and/or testing for these family members.  FOLLOW-UP: Lastly, we discussed with Mr. Feuerborn that cancer genetics is a rapidly advancing field and it is possible that new genetic tests will be appropriate for him and/or his family members in the future. We encouraged him to remain in contact with cancer genetics on an annual basis so we can update his personal and family histories and let him know of advances in cancer genetics that may benefit this family.   Our contact number was provided. Mr. Massi questions were answered to his satisfaction, and he knows he is welcome to call us at anytime with additional questions or concerns.   Faith Rogue, MS, [facility] Genetic Counselor [email or web address] Phone: [phone]

## 2020-09-25 NOTE — Telephone Encounter (Signed)
Revealed negative genetic testing to patient's wife, [name]. We discussed that we do not know why he has had cancer or why there is cancer in the family. It could be due to a different gene that we are not testing, or something our current technology cannot pick up.  It will be important for him to keep in contact with genetics to learn if additional testing may be needed in the future.

## 2020-11-03 ENCOUNTER — Other Ambulatory Visit: Payer: Medicare HMO

## 2020-11-03 ENCOUNTER — Other Ambulatory Visit: Payer: Self-pay

## 2020-11-03 DIAGNOSIS — C61 Malignant neoplasm of prostate: Secondary | ICD-10-CM

## 2020-11-04 LAB — PSA: Prostate Specific Ag, Serum: <0.1 ng/mL (ref 0.0–4.0)

## 2021-01-16 ENCOUNTER — Encounter: Payer: Self-pay | Admitting: Physician Assistant

## 2021-02-15 ENCOUNTER — Ambulatory Visit: Payer: Medicare HMO | Admitting: Physician Assistant

## 2021-02-15 ENCOUNTER — Encounter: Payer: Self-pay | Admitting: Physician Assistant

## 2021-02-15 VITALS — BP 142/82 | HR 74 | Ht 74.0 in | Wt 225.0 lb

## 2021-02-15 DIAGNOSIS — Z800 Family history of malignant neoplasm of digestive organs: Secondary | ICD-10-CM

## 2021-02-15 NOTE — Progress Notes (Signed)
Chief Complaint: Family history of pancreatic cancer  HPI:    Lee Graham is a 70 year old Caucasian male with a past medical history as listed below including a family history of pancreatic cancer and colon cancer and a personal history of prostate cancer, who was referred to me by Albina Billet, MD for a complaint of family history of pancreatic cancer.    11/12/2016 CT of the abdomen pelvis with contrast showed no adenopathy or specific evidence of metastatic products State cancer in the abdomen or pelvis.  Pancreas unremarkable.    08/31/2020 Lynch syndrome testing was negative.    09/25/2020 patient seen in clinic by the genetic counselor.  Described he had 17 siblings.  1 brother had brain and pancreatic cancer and died at 39, another brother had pancreatic cancer and another brother had pancreatic, colon, liver and brain cancer.  Also 4 nephews who have died of cancer with unknown types except for 1 which was pancreatic.  Mother also had pancreatic cancer.  That time was discussed that Mr. Stephens test results were considered negative (normal).  It was noted that he met criteria for pancreatic cancer screening.    Today, the patient presents to clinic accompanied by his wife.  He tells me he was referred by the geneticist due to his strong family history of pancreatic cancer and multiple first-degree relatives.  Personally he has had prostate cancer and has had negative genetic testing for other processes at the moment.  He feels well with no GI complaints.  Currently had an EGD and colonoscopy last year closer to his home near [facility] but his insurance would not cover for him to see them for this reason.    Denies fever, chills, weight loss, abdominal pain, nausea, vomiting, change in bowel habits or blood in his stool.  Past Medical History:  Diagnosis Date   Arthritis    Cancer ([place]) 11/13/2016   prostate   Deafness in left ear    ONLY 30% HEARING IN RIGHT EAR   Family history  of brain cancer    Family history of colon cancer    Family history of lung cancer    Family history of pancreatic cancer    Family history of prostate cancer    GERD (gastroesophageal reflux disease)    H/O   Hyperlipidemia    Hypertension     Past Surgical History:  Procedure Laterality Date   COLONOSCOPY WITH PROPOFOL N/A 08/30/2015   Procedure: COLONOSCOPY WITH PROPOFOL;  Surgeon: Christene Lye, MD;  Location: [facility] END[facility]COPY;  Service: Endoscopy;  Laterality: N/A;   COLONOSCOPY WITH PROPOFOL N/A 08/09/2020   Procedure: COLONOSCOPY WITH PROPOFOL;  Surgeon: Robert Bellow, MD;  Location: [facility] ENDOSCOPY;  Service: Endoscopy;  Laterality: N/A;   CYSTOSCOPY W/ RETROGRADES Bilateral 01/11/2019   Procedure: CYSTOSCOPY WITH RETROGRADE PYELOGRAM;  Surgeon: Hollice Espy, MD;  Location: [facility] ORS;  Service: Urology;  Laterality: Bilateral;   CYSTOSCOPY WITH BIOPSY N/A 01/11/2019   Procedure: CYSTOSCOPY WITH Bladder BIOPSY;  Surgeon: Hollice Espy, MD;  Location: [facility] ORS;  Service: Urology;  Laterality: N/A;   FINGER SURGERY Right 2008   with skin graft    LIPOMA EXCISION Bilateral 03/13/2018   Procedure: EXCISION LIPOMA- Right  SHOULDER AND RIGHT BACK;  Surgeon: Robert Bellow, MD;  Location: [facility] ORS;  Service: General;  Laterality: Bilateral;   ROBOT ASSISTED LAPAROSCOPIC RADICAL PROSTATECTOMY N/A 12/09/2016   Procedure: ROBOTIC ASSISTED LAPAROSCOPIC RADICAL PROSTATECTOMY WITH BILATERAL PELVIC NODE DISSECTION;  Surgeon:  Hollice Espy, MD;  Location: [facility] ORS;  Service: Urology;  Laterality: N/A;    Current Outpatient Medications  Medication Sig Dispense Refill   metoprolol succinate (TOPROL-XL) 50 MG 24 hr tablet Take 50 mg by mouth every morning.      NONFORMULARY OR COMPOUNDED ITEM Trimix (30/1/10)-(Pap/Phent/PGE)  Dosage: Inject 1 cc per injection  Vial 64m  Qty 1 Refills 3  [phone] [phone] each 3   simvastatin (ZOCOR) 40  MG tablet Take 40 mg by mouth daily at 6 PM.      telmisartan (MICARDIS) 20 MG tablet Take 20 mg by mouth every morning.      No current facility-administered medications for this visit.    Allergies as of 02/15/2021 - Review Complete 02/15/2021  Allergen Reaction Noted   Bactrim [sulfamethoxazole-trimethoprim] Nausea And Vomiting and Other (See Comments) 12/03/2018    Family History  Problem Relation Age of Onset   Colon cancer Father        dx 540s  Prostate cancer Father        dx 69s  Lung cancer Father    Pancreatic cancer Mother    Colon cancer Mother    Lung cancer Mother    Brain cancer Brother    Colon cancer Brother    Prostate cancer Brother    Colon polyps Sister    Prostate cancer Brother [id number]  Colon cancer Brother 544  Pancreatic cancer Brother    Liver cancer Brother    Brain cancer Brother    Pancreatic cancer Brother 430  Brain cancer Brother    Cancer Brother        d. 579s  Pancreatic cancer Brother    Cancer Sister        possible male?   Cancer Maternal Aunt        unk types   Cancer Maternal Uncle        unk types   Cancer Paternal Aunt        unk types   Cancer Paternal Uncle        unk type   Cancer Maternal Grandmother        unk type   Pancreatic cancer Nephew 60   Cancer Nephew    Cancer Nephew    Cancer Nephew    Bladder Cancer Neg Hx    Kidney cancer Neg Hx     Social History   Socioeconomic History   Marital status: Married    Spouse name: Not on file   Number of children: Not on file   Years of education: Not on file   Highest education level: Not on file  Occupational History   Not on file  Tobacco Use   Smoking status: Former    Types: Cigars    Quit date: 09/29/1970    Years since quitting: 50.4   Smokeless tobacco: Former  VScientific laboratory technicianUse: Never used  Substance and Sexual Activity   Alcohol use: No    Alcohol/week: 0.0 standard drinks   Drug use: No   Sexual activity: Yes    Birth  control/protection: None  Other Topics Concern   Not on file  Social History Narrative   Not on file   Social Determinants of Health   Financial Resource Strain: Not on file  Food Insecurity: Not on file  Transportation Needs: Not on file  Physical Activity: Not on file  Stress: Not on file  Social Connections: Not on file  Intimate Partner Violence: Not on file    Review of Systems:    Constitutional: No weight loss, fever or chills Skin: No rash  Cardiovascular: No chest pain Respiratory: No SOB  Gastrointestinal: See HPI and otherwise negative Genitourinary: No dysuria  Neurological: No headache, dizziness or syncope Musculoskeletal: No new muscle or joint pain Hematologic: No bleeding  Psychiatric: No history of depression or anxiety   Physical Exam:  BP (!) 142/82   Pulse 74   Ht '6\' 2"'  (1.88 m)   Wt 225 lb (102.1 kg)   SpO2 98%   BMI 28.89 kg/m    Constitutional:   Pleasant Caucasian male appears to be in NAD, Well developed, Well nourished, alert and cooperative Head:  Normocephalic and atraumatic. Eyes:   PEERL, EOMI. No icterus. Conjunctiva pink. Ears:  Normal auditory acuity. Neck:  Supple Throat: Oral cavity and pharynx without inflammation, swelling or lesion.  Respiratory: Respirations even and unlabored. Lungs clear to auscultation bilaterally.   No wheezes, crackles, or rhonchi.  Cardiovascular: Normal S1, S2. No MRG. Regular rate and rhythm. No peripheral edema, cyanosis or pallor.  Gastrointestinal:  Soft, nondistended, nontender. No rebound or guarding. Normal bowel sounds. No appreciable masses or hepatomegaly. Rectal:  Not performed.  Msk:  Symmetrical without gross deformities. Without edema, no deformity or joint abnormality.  Neurologic:  Alert and  oriented x4;  grossly normal neurologically.  Skin:   Dry and intact without significant lesions or rashes. Psychiatric: Demonstrates good judgement and reason without abnormal affect or  behaviors.  See HPI for recent labs.  Assessment: 1.  Family history of pancreatic cancer: In over 5 first-degree relatives including brothers and parents  Plan: 1.  Discussed with patient that there are no true guidelines for pancreatic cancer surveillance, but he does certainly have a strong family history which would warrant following up with imaging.  Explained that we could either pursue EUS versus MRI/MRCP with and without contrast, typically imaging is yearly after that point.  He would like to have this done.  Patient requested that this be done in an imaging center that was stand-alone and closer to [facility] (mainly for cost reasons) 2.  Briefly discussed EUS, this is a procedure with anesthesia and would not pursue this unless MRI is revealing for abnormality.  Did explain that he would be assigned to Dr. Rush Landmark today as he is one of our specialist in this area and I will also get his opinion on surveillance. 3.  Patient to follow in clinic per recommendations after imaging.  Lee Newer, PA-C [facility] 02/15/2021, 11:21 AM  Cc: Albina Billet, MD

## 2021-02-15 NOTE — Patient Instructions (Signed)
IMAGING:  You will be contacted by [facility] (Your caller ID will indicate phone # [phone]) within the next business 7-10 business days to schedule your MRI Abdomen. If you have not heard from them within 7-10 business days, please call [facility] at [phone] to follow up on the status of your appointment.     If you are age 72 or older, your body mass index should be between 23-30. Your Body mass index is 28.89 kg/m. If this is out of the aforementioned range listed, please consider follow up with your Primary Care Provider.  If you are age 68 or younger, your body mass index should be between 19-25. Your Body mass index is 28.89 kg/m. If this is out of the aformentioned range listed, please consider follow up with your Primary Care Provider.   __________________________________________________________  The [place] [facility] providers would like to encourage you to use [facility] to communicate with providers for non-urgent requests or questions.  Due to long hold times on the telephone, sending your provider a message by [facility] may be a faster and more efficient way to get a response.  Please allow 48 business hours for a response.  Please remember that this is for non-urgent requests.

## 2021-02-16 ENCOUNTER — Telehealth: Payer: Self-pay | Admitting: Physician Assistant

## 2021-02-16 DIAGNOSIS — Z1389 Encounter for screening for other disorder: Secondary | ICD-10-CM

## 2021-02-16 NOTE — Telephone Encounter (Signed)
Attempted to return call to patient's wife twice. No vm is set up. Unable to leave a message at this time.

## 2021-02-16 NOTE — Telephone Encounter (Signed)
Spoke with patient's wife, I have advised that we have placed the order for the x-ray. She has been advised that patient can come to the x-ray department in the basement of our office building on Monday between 8 am - 4:30 pm. . No appt is necessary. Pt's wife is aware that once Anderson Malta reviews those results we can determine how to proceed with MRI. Pt's wife verbalized understanding and had no concerns at the end of the call.

## 2021-02-16 NOTE — Telephone Encounter (Signed)
Pt's wife returned call and stated that the place where patient is having his MRI can complete his x-ray prior to that. Advised that we have placed the orders and they can see them in epic. Advised that it is perfectly fine if he proceed with x-ray closer to him. Pt's wife will contact radiology. She verbalized understanding and had no concerns at the end of the call.

## 2021-02-16 NOTE — Telephone Encounter (Signed)
[facility] called and stated the patients wife called them and she would like to have an xray to check orbit prior to MRI to detect for lead. Wondering if order can be added.

## 2021-02-16 NOTE — Progress Notes (Addendum)
Attending Physician's Attestation   I have reviewed the chart.   I agree with the Advanced Practitioner's note, impression, and recommendations with any updates as below.  Based on the patient's significant family history, he does meet criteria for high risk pancreatic cancer screening cohort via CAPS cohort by being greater than 72 years old, having a family with 2 more members with a history of pancreas cancer (2 of which had a first-degree relationship) as well as the patient still having a first-degree relationship with one of the relatives. [place] the country, there are different ways of screening. We are pursuing here at [facility] health a 49-monthMRI abdomen/MRCP alternating with a 653-monthUS such that in 1 year the patient will have 2 studies, as long as insurance approves. I recommend that he have his first study as an MRI/MRCP and as long as there are no abnormalities found there we will schedule him for a 6-104-monthllow-up EUS. With pancreatic cancer screening, patient do end up having more procedures and imaging studies done then most individuals, it also increases the risk that biopsies of the pancreas could be obtained if abnormalities are found but may not turn out to be pancreas cancer. If he is up for this screening, then please move forward with scheduling him for MRI/MRCP. If he is not up for this more intensive screening, then further discussions should be had. He will be due for colon cancer screening and colon polyp surveillance in 2025 based on his multiple tubular adenomas found.   GabJustice BritainD [facility] Office # [phone]

## 2021-02-16 NOTE — Addendum Note (Signed)
Addended by: Justice Britain on: 02/16/2021 03:48 AM   Modules accepted: Level of Service

## 2021-02-16 NOTE — Telephone Encounter (Signed)
Spoke with patient's wife, she states that when they were called to schedule patient's MRI radiology asked if he had any metal in his eyes before. 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 Radiology is requesting an order for an orbital x-ray prior to proceeding with MRI. Please advise, thanks.

## 2021-02-19 ENCOUNTER — Telehealth: Payer: Self-pay

## 2021-02-19 DIAGNOSIS — Z800 Family history of malignant neoplasm of digestive organs: Secondary | ICD-10-CM

## 2021-02-19 NOTE — Telephone Encounter (Signed)
Patient is already scheduled for his MRI/MRCP, awaiting orbital x-ray prior to MRI. Spoke with patient's wife in regards to alternating between MRI/MRCP and EUS every 6 months. Patient's wife verbalized understanding of all information and states that she will give this information to the patient. Wife had no concerns at the end of the call.

## 2021-02-19 NOTE — Telephone Encounter (Signed)
-----   Message from Levin Erp, [place] sent at 02/16/2021  8:29 AM EDT ----- Regarding: can you call Please let patient know that our pancreatic specialist Dr. Rush Landmark who is now his physician recommend that he have an MRI MRCP and then have an EUS procedure (we did discuss this in clinic) 6 months after.  He will end up having some sort of screening every 6 months.  Thanks, JL L ----- Message ----- From: Irving Copas., MD Sent: 02/16/2021   3:39 AM EDT To: Levin Erp, PA     ----- Message ----- From: Levin Erp, [place] Sent: 02/15/2021   1:02 PM EDT To: Irving Copas., MD

## 2021-02-22 NOTE — Telephone Encounter (Signed)
Pt's wife Butch Penny called to inform that pt cancelled MRI because Copay was too high since it was scheduled at a location that is part of a hospital. She stated that unfortunately, pt will not be having this exam.

## 2021-02-22 NOTE — Telephone Encounter (Signed)
Spoke with patient's wife, advised that we can try getting patient's MRI moved to [facility] in [place]. Pt's wife states that she is pretty sure that he just does not want to proceed at this time. Advised that I will let providers know. Pt's wife had no concerns at the end of the call.

## 2021-02-22 NOTE — Telephone Encounter (Signed)
If the issue is the MRI being completed, he could have a pancreas protocol CT abdomen. Some sort of cross-sectional imaging is always ideal before beginning the endoscopic ultrasound surveillance. However, if the patient does not sure that he would be able to follow-up with imaging studies and/or ultrasounds together beginning surveillance does not make the best of sense. We are happy to work with the patient and see him back in clinic to discuss things further if necessary. Let me know what they decide. Thanks. GM

## 2021-02-23 ENCOUNTER — Ambulatory Visit: Admission: RE | Admit: 2021-02-23 | Payer: Medicare HMO | Source: Ambulatory Visit

## 2021-02-23 NOTE — Telephone Encounter (Signed)
Spoke with patient's wife in regards to information. She states that she will let patient know and they will give [place] a call back if he wishes to proceed. Pt's wife had no concerns at the end of the call.

## 2021-03-08 ENCOUNTER — Telehealth: Payer: Self-pay | Admitting: Physician Assistant

## 2021-03-08 DIAGNOSIS — Z800 Family history of malignant neoplasm of digestive organs: Secondary | ICD-10-CM

## 2021-03-08 NOTE — Telephone Encounter (Signed)
Spoke with patient's wife, she requested that MRI order be sent to [facility] in [place]. She states that patient no longer needs x-ray. He had one in 2018 when he was having prostate cancer work-up and no metal was found in his eye. Advised that I will fax order over today and she can reach out to them tomorrow to set up an appt for the patient. Pt's wife verbalized understanding and had no concerns at the end of the call.  Called [facility] in [place], I was informed by Patsy that all of their fax machines are down right now and she did not know when they would be back up. Advised that I would call back at a later date. Pt's wife is aware of this information and that I will call her with an update.

## 2021-03-08 NOTE — Telephone Encounter (Signed)
Pt's wife called requesting that the MRI ordered for her husband be changed to a different facility that is in their insurance network.  The one they want is [facility], phone number [phone].  Please call if you have any questions.  Thank you.

## 2021-03-12 NOTE — Telephone Encounter (Signed)
Spoke with Mariann Laster at [facility] in [place], she states that they had major fax issues last week. She has given me the MRI fax number [phone], no referral form needed, just fax #. I have faxed MRCP orders to [facility] as requested by patient's wife. I spoke with patient's wife and she is aware. Advised to give them a day or 2 to process the order. She verbalized understanding and had no concerns at the end of the call.

## 2021-03-13 NOTE — Telephone Encounter (Signed)
Multiple fax have failed to [facility] in [place]. I called their office and spoke with Mariann Laster, she states that the fax machines are still down and they do not have an update. Advised that I will reach out to patient's wife to see if they would like to have this done somewhere else.  Spoke with patient's wife, she states that the medical centers in the area have bad ratings. Pt's wife would like for me to mail order to their home so they can take it to [facility]. Advised that I will place a copy of the order in the mail today, advised them to let me know if they had not received it by the end of next week. Lee Graham verbalized understanding and had no concerns at the end of the call.   Letter mailed to patient along with MRI order.

## 2021-03-13 NOTE — Telephone Encounter (Signed)
Spoke with [name], advised that one of the fax number's that she provided is for the [facility]. She still wants me to fax because they may be able to add the order in the system for [facility] location to see. I have faxed MRI order to both fax numbers provided below. [name] had no other concerns at the end of the call.

## 2021-03-13 NOTE — Telephone Encounter (Signed)
Pt's wife called with two additional numbers for you to send info to [facility].  [phone]  Thank you.

## 2021-04-16 ENCOUNTER — Ambulatory Visit: Payer: Medicare HMO | Admitting: Radiation Oncology

## 2021-05-07 NOTE — Progress Notes (Deleted)
05/08/2021 12:21 PM   Lee Graham February 22, 1951 [id number]  Referring provider: Albina Billet, MD [address] [address]   [place],  [place] [address]  No chief complaint on file.  Urological history 1. Prostate cancer -PSA pending  -high risk prostate cancer status post prostatectomy 12/2016 followed by adjuvant radiation -Surgical pathology consistent with Gleason 3+5, focal extraprostatic extension, right seminal vesicle involvement, and microscopic bladder neck invasion all present. Surgical margins negative. Negative lymph nodes.  pT2bN0 M0  2. High risk hematuria -former smoker -gross hematuria and was found to have an erythematous patch for which he underwent biopsy on 01/11/2019 along with bilateral retrogrades.  This indicated findings consistent with radiation cystitis -no reports of gross heme  3. ED -contributing factors of age, history of smoking, prostate cancer, pelvic surgery, pelvic radiation, HTN and HLD -managed with ICI  HPI: Lee Graham is a 70 y.o. male who presents today for 6 month follow up.      PMH: Past Medical History:  Diagnosis Date   Arthritis    Cancer ([place]) 11/13/2016   prostate   Deafness in left ear    ONLY 30% HEARING IN RIGHT EAR   Family history of brain cancer    Family history of colon cancer    Family history of lung cancer    Family history of pancreatic cancer    Family history of prostate cancer    GERD (gastroesophageal reflux disease)    H/O   Hyperlipidemia    Hypertension     Surgical History: Past Surgical History:  Procedure Laterality Date   COLONOSCOPY WITH PROPOFOL N/A 08/30/2015   Procedure: COLONOSCOPY WITH PROPOFOL;  Surgeon: Christene Lye, MD;  Location: [facility];  Service: Endoscopy;  Laterality: N/A;   COLONOSCOPY WITH PROPOFOL N/A 08/09/2020   Procedure: COLONOSCOPY WITH PROPOFOL;  Surgeon: Robert Bellow, MD;  Location: [facility];  Service: Endoscopy;  Laterality: N/A;    CYSTOSCOPY W/ RETROGRADES Bilateral 01/11/2019   Procedure: CYSTOSCOPY WITH RETROGRADE PYELOGRAM;  Surgeon: Hollice Espy, MD;  Location: [facility];  Service: Urology;  Laterality: Bilateral;   CYSTOSCOPY WITH BIOPSY N/A 01/11/2019   Procedure: CYSTOSCOPY WITH Bladder BIOPSY;  Surgeon: Hollice Espy, MD;  Location: [facility] OR[facility];  Service: Urology;  Laterality: N/A;   FINGER SURGERY Right 2008   with skin graft    LIPOMA EXCISION Bilateral 03/13/2018   Procedure: EXCISION LIPOMA- Right  SHOULDER AND RIGHT BACK;  Surgeon: Robert Bellow, MD;  Location: [facility] OR[facility];  Service: General;  Laterality: Bilateral;   ROBOT ASSISTED LAPAROSCOPIC RADICAL PROSTATECTOMY N/A 12/09/2016   Procedure: ROBOTIC ASSISTED LAPAROSCOPIC RADICAL PROSTATECTOMY WITH BILATERAL PELVIC NODE DISSECTION;  Surgeon: Hollice Espy, MD;  Location: [facility] ORS;  Service: Urology;  Laterality: N/A;    Home Medications:  Allergies as of 05/08/2021       Reactions   Bactrim [sulfamethoxazole-trimethoprim] Nausea And Vomiting, Other (See Comments)   hallucinations        Medication List        Accurate as of May 07, 2021 12:21 PM. If you have any questions, ask your nurse or doctor.          metoprolol succinate 50 MG 24 hr tablet Commonly known as: TOPROL-XL Take 50 mg by mouth every morning.   NONFORMULARY OR COMPOUNDED ITEM Trimix (30/1/10)-(Pap/Phent/PGE)  Dosage: Inject 1 cc per injection  Vial 79ml  Qty 1 Refills 3  [place] [phone] Fax [phone]   simvastatin 40 MG  tablet Commonly known as: ZOCOR Take 40 mg by mouth daily at 6 PM.   telmisartan 20 MG tablet Commonly known as: MICARDIS Take 20 mg by mouth every morning.        Allergies:  Allergies  Allergen Reactions   Bactrim [Sulfamethoxazole-Trimethoprim] Nausea And Vomiting and Other (See Comments)    hallucinations    Family History: Family History  Problem Relation Age of Onset   Colon cancer Father         dx 22s   Prostate cancer Father        dx 68s   Lung cancer Father    Pancreatic cancer Mother    Colon cancer Mother    Lung cancer Mother    Brain cancer Brother    Colon cancer Brother    Prostate cancer Brother    Colon polyps Sister    Prostate cancer Brother 73   Colon cancer Brother 65   Pancreatic cancer Brother    Liver cancer Brother    Brain cancer Brother    Pancreatic cancer Brother 52   Brain cancer Brother    Cancer Brother        d. 36s   Pancreatic cancer Brother    Cancer Sister        possible male?   Cancer Maternal Aunt        unk types   Cancer Maternal Uncle        unk types   Cancer Paternal Aunt        unk types   Cancer Paternal Uncle        unk type   Cancer Maternal Grandmother        unk type   Pancreatic cancer Nephew 60   Cancer Nephew    Cancer Nephew    Cancer Nephew    Bladder Cancer Neg Hx    Kidney cancer Neg Hx     Social History:  reports that he quit smoking about 50 years ago. His smoking use included cigars. He has quit using smokeless tobacco. He reports that he does not drink alcohol and does not use drugs.  ROS: For pertinent review of systems please refer to history of present illness  Physical Exam: There were no vitals taken for this visit.  Constitutional:  Well nourished. Alert and oriented, No acute distress. HEENT: [place] AT, moist mucus membranes.  Trachea midline Cardiovascular: No clubbing, cyanosis, or edema. Respiratory: Normal respiratory effort, no increased work of breathing. GI: Abdomen is soft, non tender, non distended, no abdominal masses. Liver and spleen not palpable.  No hernias appreciated.  Stool sample for occult testing is not indicated.   GU: No CVA tenderness.  No bladder fullness or masses.  Patient with circumcised/uncircumcised phallus. ***Foreskin easily retracted***  Urethral meatus is patent.  No penile discharge. No penile lesions or rashes. Scrotum without lesions, cysts, rashes and/or  edema.  Testicles are located scrotally bilaterally. No masses are appreciated in the testicles. Left and right epididymis are normal. Rectal: Patient with  normal sphincter tone. Anus and perineum without scarring or rashes. No rectal masses are appreciated. Prostate is approximately *** grams, *** nodules are appreciated. Seminal vesicles are normal. Skin: No rashes, bruises or suspicious lesions. Lymph: No inguinal adenopathy. Neurologic: Grossly intact, no focal deficits, moving all 4 extremities. Psychiatric: Normal mood and affect.   Laboratory Data: Pending   Assessment & Plan:    1. Prostate cancer [facility]) Personal history of prostate cancer status post prostatectomy  followed by salvage radiation Currently no evidence of disease Continued PSA on a every 6 month basis, lab only in 6 months and visit in 1 year  2. 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  Zara Council, PA-C  [facility] [address], [place], [place] [address] [phone]

## 2021-05-08 ENCOUNTER — Encounter: Payer: Self-pay | Admitting: Urology

## 2021-05-08 ENCOUNTER — Ambulatory Visit: Payer: Self-pay | Admitting: Urology

## 2021-05-08 DIAGNOSIS — N3040 Irradiation cystitis without hematuria: Secondary | ICD-10-CM

## 2021-05-08 DIAGNOSIS — N5231 Erectile dysfunction following radical prostatectomy: Secondary | ICD-10-CM

## 2021-05-08 DIAGNOSIS — C61 Malignant neoplasm of prostate: Secondary | ICD-10-CM

## 2021-05-15 ENCOUNTER — Telehealth: Payer: Self-pay

## 2021-05-15 ENCOUNTER — Telehealth: Payer: Self-pay | Admitting: Urology

## 2021-05-15 DIAGNOSIS — Z800 Family history of malignant neoplasm of digestive organs: Secondary | ICD-10-CM

## 2021-05-15 NOTE — Telephone Encounter (Signed)
Received a call from Shelton Silvas [phone]) at [facility], she states that they are waiting on the authorization for patient's MRI. Shelton Silvas reports that she left a message for Amy last week, but has not heard anything back. Advised that I will check on this and let her know.   Secure staff message sent to [facility].

## 2021-05-15 NOTE — Telephone Encounter (Signed)
Pts wife would like someone to call her back and let her know why her husband needs an xray and if he does indeed need an xray he does not want it done here at [facility]. Her callback number is [phone].

## 2021-05-16 NOTE — Telephone Encounter (Signed)
auth#: [id number] valid from 12/14-1/13/23  Correction phone number is [phone]. I called and spoke with Shelton Silvas. I have provided her with the authorization number for patient's MRCP. They will contact pt to schedule his appt. Shelton Silvas had no other concerns at the end of the call.

## 2021-05-18 NOTE — Telephone Encounter (Signed)
Pts wife wants someone to call her to explain to her why he needs an xray and if he does indeed need an xray she does not want it done here at [facility], but outside of here because she can get it done cheaper. She would like a call back today.

## 2021-05-18 NOTE — Telephone Encounter (Signed)
She said she is going to check with his other dr Beverlee Nims getting his labs done. I advised her we could get him scheduled if not.

## 2021-05-18 NOTE — Telephone Encounter (Signed)
I'll check back with her in a minute.

## 2021-05-21 ENCOUNTER — Other Ambulatory Visit: Payer: Medicare HMO

## 2021-05-21 ENCOUNTER — Other Ambulatory Visit: Payer: Self-pay

## 2021-05-21 DIAGNOSIS — C61 Malignant neoplasm of prostate: Secondary | ICD-10-CM

## 2021-05-22 LAB — PSA: Prostate Specific Ag, Serum: <0.1 ng/mL (ref 0.0–4.0)

## 2021-05-23 NOTE — Progress Notes (Signed)
05/24/2021 1:30 PM   Lee Graham [birth year] [id number]  Referring provider: Albina Billet, MD [address]   [place],  [place] [address]  Chief Complaint  Patient presents with   Prostate Cancer   Urological history 1. Prostate cancer -PSA <0.1 in 05/2021 -high risk prostate cancer status post prostatectomy 12/2016 followed by adjuvant radiation -Surgical pathology consistent with Gleason 3+5, focal extraprostatic extension, right seminal vesicle involvement, and microscopic bladder neck invasion all present. Surgical margins negative. Negative lymph nodes.  pT2bN0 M0  2. High risk hematuria -former smoker -gross hematuria and was found to have an erythematous patch for which he underwent biopsy on 01/11/2019 along with bilateral retrogrades.  This indicated findings consistent with radiation cystitis -no reports of gross heme  3. ED -contributing factors of age, history of smoking, prostate cancer, pelvic surgery, pelvic radiation, HTN and HLD -managed with ICI  HPI: Lee Graham is a 70 y.o. male who presents today for 6 month follow up.    He has not seen any blood in his urine.  He has not had any difficultly with urination.    He is using ICI successfully.    Patient is not having spontaneous erections.  He denies any pain or curvature with erections.      PMH: Past Medical History:  Diagnosis Date   Arthritis    Cancer ([facility]) 11/13/2016   prostate   Deafness in left ear    ONLY 30% HEARING IN RIGHT EAR   Family history of brain cancer    Family history of colon cancer    Family history of lung cancer    Family history of pancreatic cancer    Family history of prostate cancer    GERD (gastroesophageal reflux disease)    H/O   Hyperlipidemia    Hypertension     Surgical History: Past Surgical History:  Procedure Laterality Date   COLONOSCOPY WITH PROPOFOL N/A 08/30/2015   Procedure: COLONOSCOPY WITH PROPOFOL;  Surgeon: Christene Lye, MD;  Location: [facility];  Service: Endoscopy;  Laterality: N/A;   COLONOSCOPY WITH PROPOFOL N/A 08/09/2020   Procedure: COLONOSCOPY WITH PROPOFOL;  Surgeon: Robert Bellow, MD;  Location: [facility];  Service: Endoscopy;  Laterality: N/A;   CYSTOSCOPY W/ RETROGRADES Bilateral 01/11/2019   Procedure: CYSTOSCOPY WITH RETROGRADE PYELOGRAM;  Surgeon: Hollice Espy, MD;  Location: [facility];  Service: Urology;  Laterality: Bilateral;   CYSTOSCOPY WITH BIOPSY N/A 01/11/2019   Procedure: CYSTOSCOPY WITH Bladder BIOPSY;  Surgeon: Hollice Espy, MD;  Location: [facility];  Service: Urology;  Laterality: N/A;   FINGER SURGERY Right 2008   with skin graft    LIPOMA EXCISION Bilateral 03/13/2018   Procedure: EXCISION LIPOMA- Right  SHOULDER AND RIGHT BACK;  Surgeon: Robert Bellow, MD;  Location: [facility];  Service: General;  Laterality: Bilateral;   ROBOT ASSISTED LAPAROSCOPIC RADICAL PROSTATECTOMY N/A 12/09/2016   Procedure: ROBOTIC ASSISTED LAPAROSCOPIC RADICAL PROSTATECTOMY WITH BILATERAL PELVIC NODE DISSECTION;  Surgeon: Hollice Espy, MD;  Location: [facility];  Service: Urology;  Laterality: N/A;    Home Medications:  Allergies as of 05/24/2021       Reactions   Bactrim [sulfamethoxazole-trimethoprim] Nausea And Vomiting, Other (See Comments)   hallucinations        Medication List        Accurate as of May 24, 2021  1:30 PM. If you have any questions, ask your nurse or doctor.  metoprolol succinate 50 MG 24 hr tablet Commonly known as: TOPROL-XL Take 50 mg by mouth every morning.   NONFORMULARY OR COMPOUNDED ITEM Trimix (30/1/10)-(Pap/Phent/PGE)  Dosage: Inject 1 cc per injection  Vial 54ml  Qty 1 Refills 3  [place] [phone] Fax [phone]   simvastatin 40 MG tablet Commonly known as: ZOCOR Take 40 mg by mouth daily at 6 PM.   telmisartan 20 MG tablet Commonly known as: MICARDIS Take 20 mg by mouth every  morning.        Allergies:  Allergies  Allergen Reactions   Bactrim [Sulfamethoxazole-Trimethoprim] Nausea And Vomiting and Other (See Comments)    hallucinations    Family History: Family History  Problem Relation Age of Onset   Colon cancer Father        dx 35s   Prostate cancer Father        dx 28s   Lung cancer Father    Pancreatic cancer Mother    Colon cancer Mother    Lung cancer Mother    Brain cancer Brother    Colon cancer Brother    Prostate cancer Brother    Colon polyps Sister    Prostate cancer Brother 96   Colon cancer Brother 57   Pancreatic cancer Brother    Liver cancer Brother    Brain cancer Brother    Pancreatic cancer Brother 36   Brain cancer Brother    Cancer Brother        d. 79s   Pancreatic cancer Brother    Cancer Sister        possible male?   Cancer Maternal Aunt        unk types   Cancer Maternal Uncle        unk types   Cancer Paternal Aunt        unk types   Cancer Paternal Uncle        unk type   Cancer Maternal Grandmother        unk type   Pancreatic cancer Nephew 60   Cancer Nephew    Cancer Nephew    Cancer Nephew    Bladder Cancer Neg Hx    Kidney cancer Neg Hx     Social History:  reports that he quit smoking about 50 years ago. His smoking use included cigars. He has quit using smokeless tobacco. He reports that he does not drink alcohol and does not use drugs.  ROS: For pertinent review of systems please refer to history of present illness  Physical Exam: BP (!) 173/76    Pulse 80    Ht 6\' 2"  (1.88 m)    Wt 225 lb (102.1 kg)    BMI 28.89 kg/m   Constitutional:  Well nourished. Alert and oriented, No acute distress. HEENT: [place] AT, mask in place.  Trachea midline Cardiovascular: No clubbing, cyanosis, or edema. Respiratory: Normal respiratory effort, no increased work of breathing. Neurologic: Grossly intact, no focal deficits, moving all 4 extremities. Psychiatric: Normal mood and affect.   Laboratory  Data: Component     Latest Ref Rng & Units 01/14/2017 10/29/2018 04/27/2019 11/04/2019  Prostate Specific Ag, Serum     0.0 - 4.0 ng/mL 0.2 <0.1 <0.1 <0.1   Component     Latest Ref Rng & Units 05/02/2020 11/03/2020 05/21/2021  Prostate Specific Ag, Serum     0.0 - 4.0 ng/mL <0.1 <0.1 <0.1     Assessment & Plan:    1. Prostate cancer [facility]) -Personal  history of prostate cancer status post prostatectomy followed by salvage radiation -Currently no evidence of disease Continued PSA on a every 6 month basis, lab only in 6 months and visit in 1 year  2. 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 Radiation cystitis -Biopsy-proven radiation cystitis as previous cause for hematuria -Urinary symptoms are currently minimal with no further bleeding episodes -We will continue to follow clinically  Return in about 6 months (around 11/22/2021) for PSA only .  Zara Council, PA-C  [facility] [address] [place], [place] [address] [phone]

## 2021-05-24 ENCOUNTER — Ambulatory Visit: Payer: Medicare HMO | Admitting: Urology

## 2021-05-24 ENCOUNTER — Other Ambulatory Visit: Payer: Self-pay

## 2021-05-24 ENCOUNTER — Encounter: Payer: Self-pay | Admitting: Urology

## 2021-05-24 VITALS — BP 173/76 | HR 80 | Ht 74.0 in | Wt 225.0 lb

## 2021-05-24 DIAGNOSIS — N3040 Irradiation cystitis without hematuria: Secondary | ICD-10-CM

## 2021-05-24 DIAGNOSIS — C61 Malignant neoplasm of prostate: Secondary | ICD-10-CM

## 2021-05-24 DIAGNOSIS — N5231 Erectile dysfunction following radical prostatectomy: Secondary | ICD-10-CM | POA: Diagnosis not present

## 2021-09-20 ENCOUNTER — Other Ambulatory Visit: Payer: Self-pay | Admitting: Urology

## 2021-09-20 ENCOUNTER — Telehealth: Payer: Self-pay | Admitting: Urology

## 2021-09-20 DIAGNOSIS — N5231 Erectile dysfunction following radical prostatectomy: Secondary | ICD-10-CM

## 2021-09-20 MED ORDER — NONFORMULARY OR COMPOUNDED ITEM: 3 refills | Status: DC

## 2021-09-20 NOTE — Progress Notes (Signed)
Prescription for Trimix sent to [facility].  ?

## 2021-09-20 NOTE — Telephone Encounter (Signed)
Patient's wife called asking for a refill on patient's trimix. ?Please advise ? ?Sharyn Lull ?

## 2021-09-28 MED ORDER — NONFORMULARY OR COMPOUNDED ITEM
3 refills | Status: AC
Start: 2021-09-28 — End: ?

## 2021-09-28 NOTE — Telephone Encounter (Signed)
Pt's wife LM on triage line. Called pt's wife back, she states that [facility] did not receive the RX faxed on 09/20/21. Called [facility] they confirmed RX not received. Faxed RX again via Epic as provider is not in office to sign hard copy.  ?

## 2021-11-21 ENCOUNTER — Other Ambulatory Visit: Payer: Self-pay

## 2021-11-21 DIAGNOSIS — C61 Malignant neoplasm of prostate: Secondary | ICD-10-CM

## 2021-11-22 ENCOUNTER — Other Ambulatory Visit: Payer: Medicare HMO

## 2021-11-22 DIAGNOSIS — C61 Malignant neoplasm of prostate: Secondary | ICD-10-CM

## 2021-11-23 LAB — PSA: Prostate Specific Ag, Serum: <0.1 ng/mL (ref 0.0–4.0)

## 2021-11-26 ENCOUNTER — Telehealth: Payer: Self-pay

## 2021-11-26 NOTE — Telephone Encounter (Signed)
Attempted to notify pt, no VM set up.

## 2022-05-22 NOTE — Progress Notes (Unsigned)
05/23/2022 9:44 AM   Lee Graham Oct 03, 1950 [id number]  Referring provider: Albina Billet, MD [address]   [place],  [place] [address]   Urological history 1. Prostate cancer -PSA pending  -high risk prostate cancer status post prostatectomy 12/2016 followed by adjuvant radiation -Surgical pathology consistent with Gleason 3+5, focal extraprostatic extension, right seminal vesicle involvement, and microscopic bladder neck invasion all present. Surgical margins negative. Negative lymph nodes.  pT2bN0 M0  2. High risk hematuria -former smoker -gross hematuria and was found to have an erythematous patch for which he underwent biopsy on 01/11/2019 along with bilateral retrogrades.  This indicated findings consistent with radiation cystitis -no reports of gross heme  3. ED -contributing factors of age, history of smoking, prostate cancer, pelvic surgery, pelvic radiation, HTN and HLD -managed with [facility]  HPI: Lee Graham is a 71 y.o. male who presents today for 6 month follow up.       PMH: Past Medical History:  Diagnosis Date   Arthritis    Cancer ([place]) 11/13/2016   prostate   Deafness in left ear    ONLY 30% HEARING IN RIGHT EAR   Family history of brain cancer    Family history of colon cancer    Family history of lung cancer    Family history of pancreatic cancer    Family history of prostate cancer    GERD (gastroesophageal reflux disease)    H/O   Hyperlipidemia    Hypertension     Surgical History: Past Surgical History:  Procedure Laterality Date   COLONOSCOPY WITH PROPOFOL N/A 08/30/2015   Procedure: COLONOSCOPY WITH PROPOFOL;  Surgeon: Christene Lye, MD;  Location: [facility];  Service: Endoscopy;  Laterality: N/A;   COLONOSCOPY WITH PROPOFOL N/A 08/09/2020   Procedure: COLONOSCOPY WITH PROPOFOL;  Surgeon: Robert Bellow, MD;  Location: [facility];  Service: Endoscopy;  Laterality: N/A;   CYSTOSCOPY W/ RETROGRADES  Bilateral 01/11/2019   Procedure: CYSTOSCOPY WITH RETROGRADE PYELOGRAM;  Surgeon: Hollice Espy, MD;  Location: [facility];  Service: Urology;  Laterality: Bilateral;   CYSTOSCOPY WITH BIOPSY N/A 01/11/2019   Procedure: CYSTOSCOPY WITH Bladder BIOPSY;  Surgeon: Hollice Espy, MD;  Location: [facility];  Service: Urology;  Laterality: N/A;   FINGER SURGERY Right 2008   with skin graft    LIPOMA EXCISION Bilateral 03/13/2018   Procedure: EXCISION LIPOMA- Right  SHOULDER AND RIGHT BACK;  Surgeon: Robert Bellow, MD;  Location: [facility];  Service: General;  Laterality: Bilateral;   ROBOT ASSISTED LAPAROSCOPIC RADICAL PROSTATECTOMY N/A 12/09/2016   Procedure: ROBOTIC ASSISTED LAPAROSCOPIC RADICAL PROSTATECTOMY WITH BILATERAL PELVIC NODE DISSECTION;  Surgeon: Hollice Espy, MD;  Location: [facility];  Service: Urology;  Laterality: N/A;    Home Medications:  Allergies as of 05/23/2022       Reactions   Bactrim [sulfamethoxazole-trimethoprim] Nausea And Vomiting, Other (See Comments)   hallucinations        Medication List        Accurate as of May 22, 2022  9:44 AM. If you have any questions, ask your nurse or doctor.          metoprolol succinate 50 MG 24 hr tablet Commonly known as: TOPROL-XL Take 50 mg by mouth every morning.   NONFORMULARY OR COMPOUNDED ITEM Trimix (30/1/10)-(Pap/Phent/PGE)  Dosage: Inject 1 cc per injection  Vial 1 ml  Qty 5 Refills [place] [phone] Fax [phone]   simvastatin 40 MG tablet Commonly  known as: ZOCOR Take 40 mg by mouth daily at 6 PM.   telmisartan 20 MG tablet Commonly known as: MICARDIS Take 20 mg by mouth every morning.        Allergies:  Allergies  Allergen Reactions   Bactrim [Sulfamethoxazole-Trimethoprim] Nausea And Vomiting and Other (See Comments)    hallucinations    Family History: Family History  Problem Relation Age of Onset   Colon cancer Father        dx 30s   Prostate  cancer Father        dx 39s   Lung cancer Father    Pancreatic cancer Mother    Colon cancer Mother    Lung cancer Mother    Brain cancer Brother    Colon cancer Brother    Prostate cancer Brother    Colon polyps Sister    Prostate cancer Brother 70   Colon cancer Brother 64   Pancreatic cancer Brother    Liver cancer Brother    Brain cancer Brother    Pancreatic cancer Brother 85   Brain cancer Brother    Cancer Brother        d. 29s   Pancreatic cancer Brother    Cancer Sister        possible male?   Cancer Maternal Aunt        unk types   Cancer Maternal Uncle        unk types   Cancer Paternal Aunt        unk types   Cancer Paternal Uncle        unk type   Cancer Maternal Grandmother        unk type   Pancreatic cancer Nephew 60   Cancer Nephew    Cancer Nephew    Cancer Nephew    Bladder Cancer Neg Hx    Kidney cancer Neg Hx     Social History:  reports that he quit smoking about 51 years ago. His smoking use included cigars. He has quit using smokeless tobacco. He reports that he does not drink alcohol and does not use drugs.  ROS: For pertinent review of systems please refer to history of present illness  Physical Exam: There were no vitals taken for this visit.  Constitutional:  Well nourished. Alert and oriented, No acute distress. HEENT: [place] AT, moist mucus membranes.  Trachea midline Cardiovascular: No clubbing, cyanosis, or edema. Respiratory: Normal respiratory effort, no increased work of breathing. GU: No CVA tenderness.  No bladder fullness or masses.  Patient with circumcised/uncircumcised phallus. ***Foreskin easily retracted***  Urethral meatus is patent.  No penile discharge. No penile lesions or rashes. Scrotum without lesions, cysts, rashes and/or edema.  Testicles are located scrotally bilaterally. No masses are appreciated in the testicles. Left and right epididymis are normal. Rectal: Patient with  normal sphincter tone. Anus and perineum  without scarring or rashes. No rectal masses are appreciated. Prostate is approximately *** grams, *** nodules are appreciated. Seminal vesicles are normal. Neurologic: Grossly intact, no focal deficits, moving all 4 extremities. Psychiatric: Normal mood and affect.   Laboratory Data: N/A  Pertinent imaging N/A  Assessment & Plan:    1. Prostate cancer ([place]) -Personal history of prostate cancer status post prostatectomy followed by salvage radiation -Currently no evidence of disease -Continued PSA on a every 6 month basis, lab only in 6 months and visit in 1 year  2. 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 Radiation cystitis -  Biopsy-proven radiation cystitis as previous cause for hematuria -Urinary symptoms are currently minimal with no further bleeding episodes -We will continue to follow clinically  No follow-ups on file.  Lee Graham, [place] [address], [place], [place] [address] [phone]

## 2022-05-23 ENCOUNTER — Ambulatory Visit: Payer: Medicare HMO | Admitting: Urology

## 2022-05-23 ENCOUNTER — Encounter: Payer: Self-pay | Admitting: Urology

## 2022-05-23 VITALS — BP 162/87 | HR 81 | Ht 74.0 in | Wt 225.0 lb

## 2022-05-23 DIAGNOSIS — N3040 Irradiation cystitis without hematuria: Secondary | ICD-10-CM | POA: Diagnosis not present

## 2022-05-23 DIAGNOSIS — Z8546 Personal history of malignant neoplasm of prostate: Secondary | ICD-10-CM

## 2022-05-23 DIAGNOSIS — C61 Malignant neoplasm of prostate: Secondary | ICD-10-CM

## 2022-05-23 DIAGNOSIS — N5231 Erectile dysfunction following radical prostatectomy: Secondary | ICD-10-CM

## 2022-05-23 MED ORDER — GEMTESA 75 MG PO TABS: 75.0000 mg | ORAL_TABLET | Freq: Every day | ORAL | 0 refills | Status: DC

## 2022-05-24 LAB — PSA: Prostate Specific Ag, Serum: <0.1 ng/mL (ref 0.0–4.0)

## 2022-05-28 ENCOUNTER — Telehealth: Payer: Self-pay | Admitting: *Deleted

## 2022-05-28 NOTE — Telephone Encounter (Signed)
Notified patient as instructed, patient pleased. Discussed follow-up appointments, patient agrees  

## 2022-05-28 NOTE — Telephone Encounter (Signed)
-----   Message from Nori Riis, PA-C sent at 05/24/2022  7:24 PM EST ----- Please let [name] know that his PSA remains undetectable.  We will see him June.

## 2022-06-20 ENCOUNTER — Other Ambulatory Visit: Payer: Self-pay | Admitting: *Deleted

## 2022-06-20 MED ORDER — GEMTESA 75 MG PO TABS: 75.0000 mg | ORAL_TABLET | Freq: Every day | ORAL | 11 refills | Status: DC

## 2022-11-25 ENCOUNTER — Other Ambulatory Visit: Payer: Self-pay

## 2022-11-25 DIAGNOSIS — C61 Malignant neoplasm of prostate: Secondary | ICD-10-CM

## 2022-11-26 ENCOUNTER — Ambulatory Visit: Payer: Medicare HMO | Admitting: Urology

## 2022-11-26 ENCOUNTER — Other Ambulatory Visit: Payer: Medicare HMO

## 2022-11-26 DIAGNOSIS — C61 Malignant neoplasm of prostate: Secondary | ICD-10-CM

## 2022-11-28 LAB — PSA: Prostate Specific Ag, Serum: <0.1 ng/mL (ref 0.0–4.0)

## 2022-12-02 NOTE — Progress Notes (Unsigned)
12/03/2022 9:07 AM   [name] 16-Jul-1950 [id number]  Referring provider: Jaclyn Shaggy, MD [address]   [place],  [place] [address]   Urological history 1. Prostate cancer -PSA (11/2022) <0.1 -high risk prostate cancer status post prostatectomy (12/2016) followed by adjuvant radiation -Surgical pathology consistent with Gleason 3+5, focal extraprostatic extension, right seminal vesicle involvement, and microscopic bladder neck invasion all present. Surgical margins negative. Negative lymph nodes.  pT2bN0 M0  2. High risk hematuria -former smoker -gross hematuria and was found to have an erythematous patch for which he underwent biopsy on 01/11/2019 along with bilateral retrogrades.  This indicated findings consistent with radiation cystitis  3. ED -contributing factors of age, history of smoking, prostate cancer, pelvic surgery, pelvic radiation, HTN and HLD -managed with [facility]CI  4. Urgency/SUI -contributing factors of age, prostate cancer, prostate surgery, pelvic radiation, HTN and history of smoking -Gemtesa 75 mg daily   HPI: Lee Graham is a 72 y.o. male who presents today for 6 month follow up.    PVR ***  PMH: Past Medical History:  Diagnosis Date   Arthritis    Cancer ([facility]) 11/13/2016   prostate   Deafness in left ear    ONLY 30% HEARING IN RIGHT EAR   Family history of brain cancer    Family history of colon cancer    Family history of lung cancer    Family history of pancreatic cancer    Family history of prostate cancer    GERD (gastroesophageal reflux disease)    H/O   Hyperlipidemia    Hypertension     Surgical History: Past Surgical History:  Procedure Laterality Date   COLONOSCOPY WITH PROPOFOL N/A 08/30/2015   Procedure: COLONOSCOPY WITH PROPOFOL;  Surgeon: Kieth Brightly, MD;  Location: [facility];  Service: Endoscopy;  Laterality: N/A;   COLONOSCOPY WITH PROPOFOL N/A 08/09/2020   Procedure: COLONOSCOPY WITH  PROPOFOL;  Surgeon: Earline Mayotte, MD;  Location: [facility];  Service: Endoscopy;  Laterality: N/A;   CYSTOSCOPY W/ RETROGRADES Bilateral 01/11/2019   Procedure: CYSTOSCOPY WITH RETROGRADE PYELOGRAM;  Surgeon: Vanna Scotland, MD;  Location: [facility];  Service: Urology;  Laterality: Bilateral;   CYSTOSCOPY WITH BIOPSY N/A 01/11/2019   Procedure: CYSTOSCOPY WITH Bladder BIOPSY;  Surgeon: Vanna Scotland, MD;  Location: [facility];  Service: Urology;  Laterality: N/A;   FINGER SURGERY Right 2008   with skin graft    LIPOMA EXCISION Bilateral 03/13/2018   Procedure: EXCISION LIPOMA- Right  SHOULDER AND RIGHT BACK;  Surgeon: Earline Mayotte, MD;  Location: [facility];  Service: General;  Laterality: Bilateral;   ROBOT ASSISTED LAPAROSCOPIC RADICAL PROSTATECTOMY N/A 12/09/2016   Procedure: ROBOTIC ASSISTED LAPAROSCOPIC RADICAL PROSTATECTOMY WITH BILATERAL PELVIC NODE DISSECTION;  Surgeon: Vanna Scotland, MD;  Location: [facility];  Service: Urology;  Laterality: N/A;    Home Medications:  Allergies as of 12/03/2022       Reactions   Bactrim [sulfamethoxazole-trimethoprim] Nausea And Vomiting, Other (See Comments)   hallucinations        Medication List        Accurate as of December 02, 2022  9:07 AM. If you have any questions, ask your nurse or doctor.          Gemtesa 75 MG Tabs Generic drug: Vibegron Take 75 mg by mouth daily.   metoprolol succinate 50 MG 24 hr tablet Commonly known as: TOPROL-XL Take 50 mg by mouth every morning.   NONFORMULARY OR COMPOUNDED  ITEM Trimix (30/1/10)-(Pap/Phent/PGE)  Dosage: Inject 1 cc per injection  Vial 1 ml  Qty 5 Refills 0  [facility] [phone] Fax [phone]   simvastatin 40 MG tablet Commonly known as: ZOCOR Take 40 mg by mouth daily at 6 PM.   telmisartan 20 MG tablet Commonly known as: MICARDIS Take 20 mg by mouth every morning.        Allergies:  Allergies  Allergen Reactions   Bactrim  [Sulfamethoxazole-Trimethoprim] Nausea And Vomiting and Other (See Comments)    hallucinations    Family History: Family History  Problem Relation Age of Onset   Colon cancer Father        dx 75s   Prostate cancer Father        dx 41s   Lung cancer Father    Pancreatic cancer Mother    Colon cancer Mother    Lung cancer Mother    Brain cancer Brother    Colon cancer Brother    Prostate cancer Brother    Colon polyps Sister    Prostate cancer Brother 32   Colon cancer Brother 32   Pancreatic cancer Brother    Liver cancer Brother    Brain cancer Brother    Pancreatic cancer Brother 66   Brain cancer Brother    Cancer Brother        d. 31s   Pancreatic cancer Brother    Cancer Sister        possible male?   Cancer Maternal Aunt        unk types   Cancer Maternal Uncle        unk types   Cancer Paternal Aunt        unk types   Cancer Paternal Uncle        unk type   Cancer Maternal Grandmother        unk type   Pancreatic cancer Nephew 60   Cancer Nephew    Cancer Nephew    Cancer Nephew    Bladder Cancer Neg Hx    Kidney cancer Neg Hx     Social History:  reports that he quit smoking about 52 years ago. His smoking use included cigars. He has quit using smokeless tobacco. He reports that he does not drink alcohol and does not use drugs.  ROS: For pertinent review of systems please refer to history of present illness  Physical Exam: There were no vitals taken for this visit.  Constitutional:  Well nourished. Alert and oriented, No acute distress. HEENT: [place] AT, moist mucus membranes.  Trachea midline Cardiovascular: No clubbing, cyanosis, or edema. Respiratory: Normal respiratory effort, no increased work of breathing. GU: No CVA tenderness.  No bladder fullness or masses.  Patient with circumcised/uncircumcised phallus. ***Foreskin easily retracted***  Urethral meatus is patent.  No penile discharge. No penile lesions or rashes. Scrotum without lesions,  cysts, rashes and/or edema.  Testicles are located scrotally bilaterally. No masses are appreciated in the testicles. Left and right epididymis are normal. Rectal: Patient with  normal sphincter tone. Anus and perineum without scarring or rashes. No rectal masses are appreciated. Prostate is approximately *** grams, *** nodules are appreciated. Seminal vesicles are normal. Neurologic: Grossly intact, no focal deficits, moving all 4 extremities. Psychiatric: Normal mood and affect.   Laboratory Data: Results for orders placed or performed in visit on 11/26/22  PSA  Result Value Ref Range   Prostate Specific Ag, Serum <0.1 0.0 - 4.0 ng/mL  I have reviewed  the labs.    Pertinent imaging ***  Assessment & Plan:    1. Prostate cancer ([facility]) -Personal history of prostate cancer status post prostatectomy followed by salvage radiation -Currently no evidence of disease -Continued PSA on a every 6 month basis, lab only in 6 months and visit in 1 year  2. 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 Radiation cystitis -Biopsy-proven radiation cystitis as previous cause for hematuria -Urinary symptoms are currently minimal with no further bleeding episodes -We will continue to follow clinically -would like to try a medication for the urgency/SUI -given a trial of Gemtesa 75 mg, #42 samples given  4. Urgency/SUI ***  No follow-ups on file.  Cloretta Ned  [facility] [address] [place], [place] [address] [phone]

## 2022-12-03 ENCOUNTER — Ambulatory Visit: Payer: Medicare HMO | Admitting: Urology

## 2022-12-03 ENCOUNTER — Encounter: Payer: Self-pay | Admitting: Urology

## 2022-12-03 VITALS — BP 148/75 | HR 97 | Ht 74.0 in | Wt 225.0 lb

## 2022-12-03 DIAGNOSIS — Z8546 Personal history of malignant neoplasm of prostate: Secondary | ICD-10-CM

## 2022-12-03 DIAGNOSIS — N3040 Irradiation cystitis without hematuria: Secondary | ICD-10-CM

## 2022-12-03 DIAGNOSIS — R3915 Urgency of urination: Secondary | ICD-10-CM

## 2022-12-03 DIAGNOSIS — N5231 Erectile dysfunction following radical prostatectomy: Secondary | ICD-10-CM | POA: Diagnosis not present

## 2022-12-03 DIAGNOSIS — N393 Stress incontinence (female) (male): Secondary | ICD-10-CM

## 2022-12-03 DIAGNOSIS — C61 Malignant neoplasm of prostate: Secondary | ICD-10-CM

## 2022-12-03 LAB — BLADDER SCAN AMB NON-IMAGING: Scan Result: 0

## 2023-01-10 ENCOUNTER — Other Ambulatory Visit: Payer: Medicare HMO

## 2023-01-14 ENCOUNTER — Ambulatory Visit: Payer: Medicare HMO | Admitting: Urology

## 2023-06-05 ENCOUNTER — Other Ambulatory Visit: Payer: Medicare HMO

## 2023-06-05 ENCOUNTER — Other Ambulatory Visit: Payer: Self-pay

## 2023-06-05 DIAGNOSIS — C61 Malignant neoplasm of prostate: Secondary | ICD-10-CM

## 2023-06-06 LAB — PSA: Prostate Specific Ag, Serum: <0.1 ng/mL (ref 0.0–4.0)

## 2023-06-09 NOTE — Progress Notes (Signed)
 06/10/2023 10:08 AM   Bernett KANDICE General [birth year] [id number]  Referring provider: Corlis Honor BROCKS, MD [address]   [place],  [place] [address]   Urological history 1. Prostate cancer -PSA (06/2023) <0.1 -high risk prostate cancer status post prostatectomy (12/2016) followed by adjuvant radiation -Surgical pathology consistent with Gleason 3+5, focal extraprostatic extension, right seminal vesicle involvement, and microscopic bladder neck invasion all present. Surgical margins negative. Negative lymph nodes.  pT2bN0 M0  2. High risk hematuria -former smoker -gross hematuria and was found to have an erythematous patch for which he underwent biopsy on 01/11/2019 along with bilateral retrogrades.  This indicated findings consistent with radiation cystitis  3. ED -contributing factors of age, history of smoking, prostate cancer, pelvic surgery, pelvic radiation, HTN and HLD -managed with [facility]CI  4. Urgency/SUI -contributing factors of age, prostate cancer, prostate surgery, pelvic radiation, HTN and history of smoking  Chief Complaint  Patient presents with   Follow-up   Over Active Bladder     HPI: Lee Graham is a 73 y.o. male who presents today for 6 month follow up with his wife, Lee Graham.   Previous records reviewed.     He is having 1-7 daytime voids, 3 or more episodes of nocturia with a strong urge to urinate.  He has stress incontinence.  He leaks 3 or more times daily.  He wears absorbent underwear for protection.  He does not limit fluid intake.  He does engage in toilet mapping.   Patient denies any modifying or aggravating factors.  Patient denies any recent UTI's, gross hematuria, dysuria or suprapubic/flank pain.  Patient denies any fevers, chills, nausea or vomiting.    He has been thinking more about having something done about his stress incontinence, but he would like to do more research especially concerning what it may cost him out-of-pocket.  His  wife has been having back issues, so they have not been as sexually active.  He is not in need of a Trimix refill at this time.  PMH: Past Medical History:  Diagnosis Date   Arthritis    Cancer ([facility]) 11/13/2016   prostate   Deafness in left ear    ONLY 30% HEARING IN RIGHT EAR   Family history of brain cancer    Family history of colon cancer    Family history of lung cancer    Family history of pancreatic cancer    Family history of prostate cancer    GERD (gastroesophageal reflux disease)    H/O   Hyperlipidemia    Hypertension     Surgical History: Past Surgical History:  Procedure Laterality Date   COLONOSCOPY WITH PROPOFOL  N/A 08/30/2015   Procedure: COLONOSCOPY WITH PROPOFOL ;  Surgeon: Louanne KANDICE Muse, MD;  Location: [facility];  Service: Endoscopy;  Laterality: N/A;   COLONOSCOPY WITH PROPOFOL  N/A 08/09/2020   Procedure: COLONOSCOPY WITH PROPOFOL ;  Surgeon: Dessa Reyes ORN, MD;  Location: [facility];  Service: Endoscopy;  Laterality: N/A;   CYSTOSCOPY W/ RETROGRADES Bilateral 01/11/2019   Procedure: CYSTOSCOPY WITH RETROGRADE PYELOGRAM;  Surgeon: Penne Knee, MD;  Location: [facility];  Service: Urology;  Laterality: Bilateral;   CYSTOSCOPY WITH BIOPSY N/A 01/11/2019   Procedure: CYSTOSCOPY WITH Bladder BIOPSY;  Surgeon: Penne Knee, MD;  Location: [facility];  Service: Urology;  Laterality: N/A;   FINGER SURGERY Right 2008   with skin graft    LIPOMA EXCISION Bilateral 03/13/2018   Procedure: EXCISION LIPOMA- Right  SHOULDER AND RIGHT BACK;  Surgeon: Dessa Reyes ORN, MD;  Location: [facility];  Service: General;  Laterality: Bilateral;   ROBOT ASSISTED LAPAROSCOPIC RADICAL PROSTATECTOMY N/A 12/09/2016   Procedure: ROBOTIC ASSISTED LAPAROSCOPIC RADICAL PROSTATECTOMY WITH BILATERAL PELVIC NODE DISSECTION;  Surgeon: Penne Knee, MD;  Location: [facility];  Service: Urology;  Laterality: N/A;    Home Medications:  Allergies as of 06/10/2023        Reactions   Bactrim  [sulfamethoxazole -trimethoprim ] Nausea And Vomiting, Other (See Comments)   hallucinations        Medication List        Accurate as of June 10, 2023 10:08 AM. If you have any questions, ask your nurse or doctor.          metoprolol  succinate 50 MG 24 hr tablet Commonly known as: TOPROL -XL Take 50 mg by mouth every morning.   NONFORMULARY OR COMPOUNDED ITEM Trimix (30/1/10)-(Pap/Phent/PGE)  Dosage: Inject 1 cc per injection  Vial 1 ml  Qty 5 Refills 0  [facility] [phone] Fax [phone]   simvastatin  40 MG tablet Commonly known as: ZOCOR  Take 40 mg by mouth daily at 6 PM.   telmisartan 20 MG tablet Commonly known as: MICARDIS Take 20 mg by mouth every morning.        Allergies:  Allergies  Allergen Reactions   Bactrim  [Sulfamethoxazole -Trimethoprim ] Nausea And Vomiting and Other (See Comments)    hallucinations    Family History: Family History  Problem Relation Age of Onset   Colon cancer Father        dx 78s   Prostate cancer Father        dx 42s   Lung cancer Father    Pancreatic cancer Mother    Colon cancer Mother    Lung cancer Mother    Brain cancer Brother    Colon cancer Brother    Prostate cancer Brother    Colon polyps Sister    Prostate cancer Brother 16   Colon cancer Brother 20   Pancreatic cancer Brother    Liver cancer Brother    Brain cancer Brother    Pancreatic cancer Brother 54   Brain cancer Brother    Cancer Brother        d. 97s   Pancreatic cancer Brother    Cancer Sister        possible male?   Cancer Maternal Aunt        unk types   Cancer Maternal Uncle        unk types   Cancer Paternal Aunt        unk types   Cancer Paternal Uncle        unk type   Cancer Maternal Grandmother        unk type   Pancreatic cancer Nephew 60   Cancer Nephew    Cancer Nephew    Cancer Nephew    Bladder Cancer Neg Hx    Kidney cancer Neg Hx     Social History:  reports that  he quit smoking about 52 years ago. His smoking use included cigars. He has been exposed to tobacco smoke. He has quit using smokeless tobacco. He reports that he does not drink alcohol and does not use drugs.  ROS: For pertinent review of systems please refer to history of present illness  Physical Exam: BP (!) 146/86   Pulse 83   Ht 6' 2 (1.88 m)   Wt 225 lb (102.1 kg)   BMI 28.89 kg/m   Constitutional:  Well nourished. Alert and oriented, No acute distress. HEENT: [place] AT, moist mucus membranes.  Trachea midline Cardiovascular: No clubbing, cyanosis, or edema. Respiratory: Normal respiratory effort, no increased work of breathing. Neurologic: Grossly intact, no focal deficits, moving all 4 extremities. Psychiatric: Normal mood and affect.   Laboratory Data: Results for orders placed or performed in visit on 06/05/23  PSA   Collection Time: 06/05/23  9:27 AM  Result Value Ref Range   Prostate Specific Ag, Serum <0.1 0.0 - 4.0 ng/mL  I have reviewed the labs.    Pertinent imaging: N/A   Assessment & Plan:    1. Prostate cancer ([facility]) -PSA nondetectable -Personal history of prostate cancer status post prostatectomy followed by salvage radiation -Currently no evidence of disease -Continued PSA on a every 6 month basis, lab only in 6 months and visit in 1 year  2. Erectile dysfunction after radical prostatectomy -Continue Trimix injections, does not need a refill at this time  3. Radiation cystitis -Biopsy-proven radiation cystitis as previous cause for hematuria -Urinary symptoms are currently minimal with no further bleeding episodes -We will continue to follow clinically  4. Urgency/SUI -We discussed a referral to see if he would be a candidate for a sling or artificial sphincter, but he deferred at this time stating it is not bothersome -He would like to contact his insurance company and discuss cost prior to a referral  Return in about 6 months (around 12/08/2023)  for PSA only . And office visit in one year for PSA, OAB questionnaire.     CLOTILDA HELON RIGGERS  [facility] [address] [place], [place] [address] [phone]

## 2023-06-10 ENCOUNTER — Encounter: Payer: Self-pay | Admitting: Urology

## 2023-06-10 ENCOUNTER — Ambulatory Visit: Payer: Medicare HMO | Admitting: Urology

## 2023-06-10 VITALS — BP 146/86 | HR 83 | Ht 74.0 in | Wt 225.0 lb

## 2023-06-10 DIAGNOSIS — N5231 Erectile dysfunction following radical prostatectomy: Secondary | ICD-10-CM

## 2023-06-10 DIAGNOSIS — Z8546 Personal history of malignant neoplasm of prostate: Secondary | ICD-10-CM | POA: Diagnosis not present

## 2023-06-10 DIAGNOSIS — R3915 Urgency of urination: Secondary | ICD-10-CM

## 2023-06-10 DIAGNOSIS — C61 Malignant neoplasm of prostate: Secondary | ICD-10-CM

## 2023-06-10 DIAGNOSIS — N3040 Irradiation cystitis without hematuria: Secondary | ICD-10-CM | POA: Diagnosis not present

## 2023-06-10 NOTE — Patient Instructions (Addendum)
 Male sling for stress urinary incontinence   Artificial urinary sphincter

## 2023-09-03 ENCOUNTER — Ambulatory Visit: Admitting: Anesthesiology

## 2023-09-03 ENCOUNTER — Encounter
Admission: RE | Disposition: A | Discharge status: Home / Self Care | Payer: Self-pay | Source: Home / Self Care | Attending: Surgery

## 2023-09-03 ENCOUNTER — Ambulatory Visit
Admission: RE | Admit: 2023-09-03 | Discharge: 2023-09-03 | Disposition: A | Discharge status: Home / Self Care | Attending: Surgery | Admitting: Surgery

## 2023-09-03 DIAGNOSIS — Z79899 Other long term (current) drug therapy: Secondary | ICD-10-CM | POA: Diagnosis not present

## 2023-09-03 DIAGNOSIS — K640 First degree hemorrhoids: Secondary | ICD-10-CM | POA: Insufficient documentation

## 2023-09-03 DIAGNOSIS — Z8249 Family history of ischemic heart disease and other diseases of the circulatory system: Secondary | ICD-10-CM | POA: Diagnosis not present

## 2023-09-03 DIAGNOSIS — Z860100 Personal history of colon polyps, unspecified: Secondary | ICD-10-CM | POA: Diagnosis present

## 2023-09-03 DIAGNOSIS — Z800 Family history of malignant neoplasm of digestive organs: Secondary | ICD-10-CM | POA: Diagnosis not present

## 2023-09-03 DIAGNOSIS — I10 Essential (primary) hypertension: Secondary | ICD-10-CM | POA: Insufficient documentation

## 2023-09-03 DIAGNOSIS — I2510 Atherosclerotic heart disease of native coronary artery without angina pectoris: Secondary | ICD-10-CM | POA: Insufficient documentation

## 2023-09-03 DIAGNOSIS — Z1211 Encounter for screening for malignant neoplasm of colon: Secondary | ICD-10-CM | POA: Diagnosis not present

## 2023-09-03 DIAGNOSIS — Z87891 Personal history of nicotine dependence: Secondary | ICD-10-CM | POA: Diagnosis not present

## 2023-09-03 DIAGNOSIS — M1990 Unspecified osteoarthritis, unspecified site: Secondary | ICD-10-CM | POA: Insufficient documentation

## 2023-09-03 DIAGNOSIS — K219 Gastro-esophageal reflux disease without esophagitis: Secondary | ICD-10-CM | POA: Insufficient documentation

## 2023-09-03 HISTORY — PX: COLONOSCOPY: SHX5424

## 2023-09-03 SURGERY — Surgical Case
Anesthesia: *Unknown

## 2023-09-03 SURGERY — COLONOSCOPY
Anesthesia: General

## 2023-09-03 MED ORDER — EPHEDRINE SULFATE-NACL 50-0.9 MG/10ML-% IV SOSY
PREFILLED_SYRINGE | INTRAVENOUS | Status: DC | PRN
  Administered 2023-09-03: 15 mg via INTRAVENOUS
  Administered 2023-09-03: 5 mg via INTRAVENOUS

## 2023-09-03 MED ORDER — SODIUM CHLORIDE 0.9 % IV SOLN
INTRAVENOUS | Status: DC
  Administered 2023-09-03: 20 mL/h via INTRAVENOUS

## 2023-09-03 MED ORDER — PROPOFOL 10 MG/ML IV BOLUS
INTRAVENOUS | Status: AC
  Filled 2023-09-03: qty 60

## 2023-09-03 MED ORDER — LIDOCAINE HCL (CARDIAC) PF 100 MG/5ML IV SOSY
PREFILLED_SYRINGE | INTRAVENOUS | Status: DC | PRN
  Administered 2023-09-03: 50 mg via INTRAVENOUS

## 2023-09-03 MED ORDER — PROPOFOL 10 MG/ML IV BOLUS
INTRAVENOUS | Status: DC | PRN
  Administered 2023-09-03: 50 mg via INTRAVENOUS
  Administered 2023-09-03: 120 ug/kg/min via INTRAVENOUS

## 2023-09-03 NOTE — H&P (Signed)
 Subjective:   CC: History of colon polyps [Z86.0100]  HPI: Lee Graham is a 73 y.o. male who returns for evaluation of above. Three tubular adnomas in 2022, recommended 3 yr f/u.   Past Medical History: has a past medical history of Arthritis, Deafness in left ear, GERD (gastroesophageal reflux disease), Hyperlipidemia, Hypertension, and Prostate cancer (CMS/HHS-HCC) (11/13/2016).  Past Surgical History: has a past surgical history that includes skin graft; prostectomy (2017); Cystoscopy (01/11/2019); cystoscopy with retrogrades (Bilateral, 01/11/2019); lipoma excision (Right, 03/13/2018); Colonoscopy (08/30/2015); finger surgery (Right, 2008); and Colonoscopy (08/09/2020).  Family History: family history includes Colon cancer in his father; Heart disease in his father and mother; High blood pressure (Hypertension) in his father and mother.  Social History: reports that he quit smoking about 55 years ago. His smoking use included cigars. He has quit using smokeless tobacco. His smokeless tobacco use included chew. He reports that he does not currently use alcohol. No history on file for drug use.  Current Medications: has a current medication list which includes the following prescription(s): metoprolol succinate, polyethylene glycol, simvastatin, telmisartan, and aspirin.  Allergies:  Allergies  Allergen Reactions  Bactrim [Sulfamethoxazole-Trimethoprim] Nausea, Hallucination and Vomiting   ROS:  A 15 point review of systems was performed and pertinent positives and negatives noted in HPI  Objective:    BP (!) 149/77  Pulse 76  Temp 36.3 C (97.3 F)  Ht 188 cm (6\' 2" )  Wt (!) 104.3 kg (230 lb)  BMI 29.53 kg/m   Constitutional : No distress, cooperative, alert  Lymphatics/Throat: Supple with no lymphadenopathy  Respiratory: Clear to auscultation bilaterally  Cardiovascular: Regular rate and rhythm  Gastrointestinal: Soft, non-tender, non-distended, no organomegaly.   Musculoskeletal: Steady gait and movement  Skin: Cool and moist  Psychiatric: Normal affect, non-agitated, not confused     LABS:  N/a   RADS: N/a  Assessment:    History of colon polyps [Z86.0100]  Plan:    1. History of colon polyps [Z86.0100] R/b/a discussed. Risks include bleeding, perforation. Benefits include diagnostic, curative procedure if needed. Alternatives include continued observation. Pt verbalized understanding.  2. Patient has elected to proceed with surgical treatment. Procedure will be scheduled.

## 2023-09-03 NOTE — Anesthesia Preprocedure Evaluation (Signed)
 Anesthesia Evaluation  Patient identified by MRN, date of birth, ID band Patient awake    Reviewed: Allergy & Precautions, NPO status , Patient's Chart, lab work & pertinent test results  History of Anesthesia Complications Negative for: history of anesthetic complications  Airway Mallampati: II  TM Distance: >3 FB Neck ROM: Full    Dental  (+) Poor Dentition, Upper Dentures   Pulmonary neg pulmonary ROS, neg sleep apnea, neg COPD, Patient abstained from smoking.Not current smoker, former smoker   breath sounds clear to auscultation- rhonchi (-) wheezing      Cardiovascular Exercise Tolerance: Good METShypertension, Pt. on medications + CAD and + Past MI  (-) Cardiac Stents and (-) CABG (-) dysrhythmias  Rhythm:Regular Rate:Normal - Systolic murmurs and - Diastolic murmurs MI in his 30s.   Neuro/Psych neg Seizures negative neurological ROS  negative psych ROS   GI/Hepatic Neg liver ROS,GERD  ,,  Endo/Other  negative endocrine ROSneg diabetes    Renal/GU negative Renal ROS     Musculoskeletal  (+) Arthritis ,    Abdominal  (+) - obese  Peds  Hematology negative hematology ROS (+)   Anesthesia Other Findings Past Medical History: No date: Arthritis 11/13/2016: Cancer (HCC)     Comment:  prostate No date: Deafness in left ear     Comment:  ONLY 30% HEARING IN RIGHT EAR No date: GERD (gastroesophageal reflux disease)     Comment:  H/O No date: Hyperlipidemia No date: Hypertension   Reproductive/Obstetrics                             Anesthesia Physical Anesthesia Plan  ASA: 3  Anesthesia Plan: General   Post-op Pain Management: Minimal or no pain anticipated   Induction: Intravenous  PONV Risk Score and Plan: 2 and Propofol infusion  Airway Management Planned: Natural Airway  Additional Equipment: None  Intra-op Plan:   Post-operative Plan:   Informed Consent: I have  reviewed the patients History and Physical, chart, labs and discussed the procedure including the risks, benefits and alternatives for the proposed anesthesia with the patient or authorized representative who has indicated his/her understanding and acceptance.     Dental advisory given  Plan Discussed with: CRNA and Anesthesiologist  Anesthesia Plan Comments: (Discussed risks of anesthesia with patient, including possibility of difficulty with spontaneous ventilation under anesthesia necessitating airway intervention, PONV, and rare risks such as cardiac or respiratory or neurological events, and allergic reactions. Discussed the role of CRNA in patient's perioperative care. Patient understands.)        Anesthesia Quick Evaluation

## 2023-09-03 NOTE — Interval H&P Note (Signed)
 History and Physical Interval Note:  09/03/2023 8:26 AM  Lee Graham  has presented today for surgery, with the diagnosis of Z86.0100 Hx of colon polyps.  The various methods of treatment have been discussed with the patient and family. After consideration of risks, benefits and other options for treatment, the patient has consented to  Procedure(s): COLONOSCOPY (N/A) as a surgical intervention.  The patient's history has been reviewed, patient examined, no change in status, stable for surgery.  I have reviewed the patient's chart and labs.  Questions were answered to the patient's satisfaction.     Hodan Wurtz Tonna Boehringer

## 2023-09-03 NOTE — Transfer of Care (Signed)
 Immediate Anesthesia Transfer of Care Note  Patient: Lee Graham  Procedure(s) Performed: COLONOSCOPY  Patient Location: PACU  Anesthesia Type:MAC  Level of Consciousness: awake, alert , and oriented  Airway & Oxygen Therapy: Patient Spontanous Breathing  Post-op Assessment: Report given to RN and Post -op Vital signs reviewed and stable  Post vital signs: stable  Last Vitals:  Vitals Value Taken Time  BP 112/64 09/03/23 0857  Temp    Pulse 81 09/03/23 0858  Resp 17 09/03/23 0858  SpO2 100 % 09/03/23 0858  Vitals shown include unfiled device data.  Last Pain:  Vitals:   09/03/23 0857  TempSrc:   PainSc: 0-No pain         Complications: No notable events documented.

## 2023-09-03 NOTE — Op Note (Addendum)
 [facility] Patient Name: Lee Graham Procedure Date: 09/03/2023 7:53 AM MRN: [id number] Account #: [id number] Date of Birth: 11-Dec-1950 Admit Type: Outpatient Age: 73 Room: [facility] ENDO ROOM 1 Gender: Male Note Status: Finalized Instrument Name: Prentice Docker [id number] Procedure:             Colonoscopy Indications:           High risk colon cancer surveillance: Personal history                         of colonic polyps Providers:             Sung Amabile MD, MD Referring MD:          Sung Amabile MD, MD (Referring MD), Jillene Bucks. Arlana Pouch, MD                         (Referring MD) Medicines:             Propofol per Anesthesia Complications:         No immediate complications. Procedure:             Pre-Anesthesia Assessment:                        - After reviewing the risks and benefits, the patient                         was deemed in satisfactory condition to undergo the                         procedure in an ambulatory setting.                        After obtaining informed consent, the colonoscope was                         passed under direct vision. Throughout the procedure,                         the patient's blood pressure, pulse, and oxygen                         saturations were monitored continuously. The                         Colonoscope was introduced through the anus and                         advanced to the the cecum, identified by the ileocecal                         valve. The colonoscopy was performed without                         difficulty. The patient tolerated the procedure well.                         The quality of the bowel preparation was adequate. Findings:      The perianal and digital rectal examinations were normal.      Non-bleeding internal  hemorrhoids were found during retroflexion. The       hemorrhoids were Grade I (internal hemorrhoids that do not prolapse).      The exam was otherwise without  abnormality. Impression:            - Non-bleeding internal hemorrhoids.                        - The examination was otherwise normal.                        - No specimens collected. Recommendation:        - Discharge patient to home.                        - Resume previous diet.                        - Written discharge instructions were provided to the                         patient.                        - Repeat colonoscopy in 7-10 years for screening                         purposes. Procedure Code(s):     --- Professional ---                        Z6109, Colorectal cancer screening; colonoscopy on                         individual at high risk Diagnosis Code(s):     --- Professional ---                        Z86.010, Personal history of colonic polyps                        K64.0, First degree hemorrhoids CPT copyright 2022 American Medical Association. All rights reserved. The codes documented in this report are preliminary and upon coder review may  be revised to meet current compliance requirements. Dr. Harrie Foreman, MD Sung Amabile MD, MD 09/03/2023 8:58:24 AM This report has been signed electronically. Number of Addenda: 0 Note Initiated On: 09/03/2023 7:53 AM Scope Withdrawal Time: 0 hours 7 minutes 41 seconds  Total Procedure Duration: 0 hours 11 minutes 31 seconds  Estimated Blood Loss:  Estimated blood loss: none.      [facility]

## 2023-09-03 NOTE — Anesthesia Postprocedure Evaluation (Signed)
 Anesthesia Post Note  Patient: Lee Graham  Procedure(s) Performed: COLONOSCOPY  Patient location during evaluation: Endoscopy Anesthesia Type: General Level of consciousness: awake and alert Pain management: pain level controlled Vital Signs Assessment: post-procedure vital signs reviewed and stable Respiratory status: spontaneous breathing, nonlabored ventilation, respiratory function stable and patient connected to nasal cannula oxygen Cardiovascular status: blood pressure returned to baseline and stable Postop Assessment: no apparent nausea or vomiting Anesthetic complications: no   No notable events documented.   Last Vitals:  Vitals:   09/03/23 0857 09/03/23 0906  BP: 112/64 111/88  Pulse: 87 86  Resp: 17 18  Temp:    SpO2: 100% 98%    Last Pain:  Vitals:   09/03/23 0906  TempSrc:   PainSc: 0-No pain                 Corinda Gubler

## 2023-09-04 ENCOUNTER — Encounter: Payer: Self-pay | Admitting: Surgery

## 2023-12-08 ENCOUNTER — Other Ambulatory Visit: Payer: Self-pay

## 2023-12-08 DIAGNOSIS — R3915 Urgency of urination: Secondary | ICD-10-CM

## 2023-12-08 DIAGNOSIS — C61 Malignant neoplasm of prostate: Secondary | ICD-10-CM

## 2023-12-08 DIAGNOSIS — N3040 Irradiation cystitis without hematuria: Secondary | ICD-10-CM

## 2023-12-08 DIAGNOSIS — N5231 Erectile dysfunction following radical prostatectomy: Secondary | ICD-10-CM

## 2023-12-09 ENCOUNTER — Ambulatory Visit: Payer: Self-pay | Admitting: Urology

## 2023-12-09 LAB — PSA: Prostate Specific Ag, Serum: 0.1 ng/mL (ref 0.0–4.0)

## 2024-06-07 ENCOUNTER — Other Ambulatory Visit: Payer: Self-pay

## 2024-06-07 DIAGNOSIS — N5231 Erectile dysfunction following radical prostatectomy: Secondary | ICD-10-CM

## 2024-06-07 DIAGNOSIS — R3915 Urgency of urination: Secondary | ICD-10-CM

## 2024-06-07 DIAGNOSIS — N3040 Irradiation cystitis without hematuria: Secondary | ICD-10-CM

## 2024-06-07 DIAGNOSIS — C61 Malignant neoplasm of prostate: Secondary | ICD-10-CM

## 2024-06-08 LAB — PSA: Prostate Specific Ag, Serum: 0.3 ng/mL (ref 0.0–4.0)

## 2024-06-08 NOTE — Progress Notes (Signed)
 "   06/09/2024 8:00 PM   Lee Graham Jan 15, 1951 [id number]  Referring provider: Corlis Honor BROCKS, MD No address on file  Urological history 1. Prostate cancer -PSA (06/2024) 0.3 -high risk prostate cancer status post prostatectomy (12/2016) followed by adjuvant radiation -Surgical pathology consistent with Gleason 3+5, focal extraprostatic extension, right seminal vesicle involvement, and microscopic bladder neck invasion all present. Surgical margins negative. Negative lymph nodes.  pT2bN0 M0  2. High risk hematuria -former smoker -gross hematuria and was found to have an erythematous patch for which he underwent biopsy on 01/11/2019 along with bilateral retrogrades.  This indicated findings consistent with radiation cystitis  3. ED -contributing factors of age, history of smoking, prostate cancer, pelvic surgery, pelvic radiation, HTN and HLD -managed with ICI  4. Urgency/SUI -contributing factors of age, prostate cancer, prostate surgery, pelvic radiation, HTN and history of smoking  Chief Complaint  Patient presents with   Prostate Cancer     HPI: Lee Graham is a 74 y.o. male who presents today for 6 month follow up with his wife, Lee Graham.   Previous records reviewed.     He is having 1-7 daytime voids, 1-2 episodes of nocturia with strong urge to urinate.  He has stress urinary continence.  Leaking 3 or more times a day.  He wears absorbent pads daily.  He limits fluid intake and engages in toilet mapping.  Patient denies any modifying or aggravating factors.  Patient denies any recent UTI's, gross hematuria, dysuria or suprapubic/flank pain.  Patient denies any fevers, chills, nausea or vomiting.    He PSA has become detectable.    Prostate Specific Ag, Serum  Latest Ref Rng 0.0 - 4.0 ng/mL  11/04/2019 <0.1   05/02/2020 <0.1   11/03/2020 <0.1   05/21/2021 <0.1   11/22/2021 <0.1   05/23/2022 <0.1   11/26/2022 <0.1   06/05/2023 <0.1   12/08/2023 0.1   06/07/2024 0.3     PMH: Past Medical History:  Diagnosis Date   Arthritis    Cancer (HCC) 11/13/2016   prostate   Deafness in left ear    ONLY 30% HEARING IN RIGHT EAR   Family history of brain cancer    Family history of colon cancer    Family history of lung cancer    Family history of pancreatic cancer    Family history of prostate cancer    GERD (gastroesophageal reflux disease)    H/O   Hyperlipidemia    Hypertension     Surgical History: Past Surgical History:  Procedure Laterality Date   COLONOSCOPY N/A 09/03/2023   Procedure: COLONOSCOPY;  Surgeon: Tye Millet, DO;  Location: [facility] ENDOSCOPY;  Service: General;  Laterality: N/A;   COLONOSCOPY WITH PROPOFOL  N/A 08/30/2015   Procedure: COLONOSCOPY WITH PROPOFOL ;  Surgeon: Louanne KANDICE Muse, MD;  Location: [facility] ENDOSCOPY;  Service: Endoscopy;  Laterality: N/A;   COLONOSCOPY WITH PROPOFOL  N/A 08/09/2020   Procedure: COLONOSCOPY WITH PROPOFOL ;  Surgeon: Dessa Reyes ORN, MD;  Location: [facility] ENDOSCOPY;  Service: Endoscopy;  Laterality: N/A;   CYSTOSCOPY W/ RETROGRADES Bilateral 01/11/2019   Procedure: CYSTOSCOPY WITH RETROGRADE PYELOGRAM;  Surgeon: Penne Knee, MD;  Location: [facility] ORS;  Service: Urology;  Laterality: Bilateral;   CYSTOSCOPY WITH BIOPSY N/A 01/11/2019   Procedure: CYSTOSCOPY WITH Bladder BIOPSY;  Surgeon: Penne Knee, MD;  Location: [facility] ORS;  Service: Urology;  Laterality: N/A;   FINGER SURGERY Right 2008   with skin graft    LIPOMA EXCISION Bilateral 03/13/2018   Procedure:  EXCISION LIPOMA- Right  SHOULDER AND RIGHT BACK;  Surgeon: Dessa Reyes ORN, MD;  Location: [facility] ORS;  Service: General;  Laterality: Bilateral;   ROBOT ASSISTED LAPAROSCOPIC RADICAL PROSTATECTOMY N/A 12/09/2016   Procedure: ROBOTIC ASSISTED LAPAROSCOPIC RADICAL PROSTATECTOMY WITH BILATERAL PELVIC NODE DISSECTION;  Surgeon: Penne Knee, MD;  Location: [facility] ORS;  Service: Urology;  Laterality: N/A;    Home Medications:  Allergies as of  06/09/2024       Reactions   Bactrim  [sulfamethoxazole -trimethoprim ] Nausea And Vomiting, Other (See Comments)   hallucinations        Medication List        Accurate as of June 09, 2024 11:59 PM. If you have any questions, ask your nurse or doctor.          hydrochlorothiazide  12.5 MG tablet Commonly known as: HYDRODIURIL  Take 12.5 mg by mouth daily.   metoprolol  succinate 50 MG 24 hr tablet Commonly known as: TOPROL -XL Take 50 mg by mouth every morning.   NONFORMULARY OR COMPOUNDED ITEM Trimix (30/1/10)-(Pap/Phent/PGE)  Dosage: Inject 1 cc per injection  Vial 1 ml  Qty 5 Refills 0  [facility] [phone] Fax [phone]   simvastatin  40 MG tablet Commonly known as: ZOCOR  Take 40 mg by mouth daily at 6 PM.   telmisartan 20 MG tablet Commonly known as: MICARDIS Take 20 mg by mouth every morning.        Allergies:  Allergies  Allergen Reactions   Bactrim  [Sulfamethoxazole -Trimethoprim ] Nausea And Vomiting and Other (See Comments)    hallucinations    Family History: Family History  Problem Relation Age of Onset   Colon cancer Father        dx 56s   Prostate cancer Father        dx 45s   Lung cancer Father    Pancreatic cancer Mother    Colon cancer Mother    Lung cancer Mother    Brain cancer Brother    Colon cancer Brother    Prostate cancer Brother    Colon polyps Sister    Prostate cancer Brother 47   Colon cancer Brother 26   Pancreatic cancer Brother    Liver cancer Brother    Brain cancer Brother    Pancreatic cancer Brother 58   Brain cancer Brother    Cancer Brother        d. 68s   Pancreatic cancer Brother    Cancer Sister        possible male?   Cancer Maternal Aunt        unk types   Cancer Maternal Uncle        unk types   Cancer Paternal Aunt        unk types   Cancer Paternal Uncle        unk type   Cancer Maternal Grandmother        unk type   Pancreatic cancer Nephew 60   Cancer Nephew     Cancer Nephew    Cancer Nephew    Bladder Cancer Neg Hx    Kidney cancer Neg Hx     Social History:  reports that he quit smoking about 53 years ago. His smoking use included cigars. He has been exposed to tobacco smoke. He has quit using smokeless tobacco. He reports that he does not drink alcohol and does not use drugs.  ROS: For pertinent review of systems please refer to history of present illness  Physical Exam: BP 127/76  Pulse 85   Wt 228 lb (103.4 kg)   SpO2 97%   BMI 29.27 kg/m   Constitutional:  Well nourished. Alert and oriented, No acute distress. HEENT:  AT, moist mucus membranes.  Trachea midline Cardiovascular: No clubbing, cyanosis, or edema. Respiratory: Normal respiratory effort, no increased work of breathing. Neurologic: Grossly intact, no focal deficits, moving all 4 extremities. Psychiatric: Normal mood and affect.   Laboratory Data: See Epic and HPI   I have reviewed the labs.    Pertinent imaging: N/A   Assessment & Plan:    1. Prostate cancer (HCC) -PSA has been increasing and is now at 0.3, representing a biochemical recurrence -Personal history of prostate cancer status post prostatectomy followed by salvage radiation -will scheduled PSMA (PET) scan and refer to Cancer Center  2. 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 Urgency/SUI -managing conservatively   Return for PSMA PET report .    Lee Graham  [facility] [address] [place], [place] [address] [phone] "

## 2024-06-09 ENCOUNTER — Ambulatory Visit: Payer: Self-pay | Admitting: Urology

## 2024-06-09 VITALS — BP 127/76 | HR 85 | Wt 228.0 lb

## 2024-06-09 DIAGNOSIS — N3040 Irradiation cystitis without hematuria: Secondary | ICD-10-CM | POA: Diagnosis not present

## 2024-06-09 DIAGNOSIS — N393 Stress incontinence (female) (male): Secondary | ICD-10-CM | POA: Diagnosis not present

## 2024-06-09 DIAGNOSIS — C61 Malignant neoplasm of prostate: Secondary | ICD-10-CM | POA: Diagnosis not present

## 2024-06-09 DIAGNOSIS — N5231 Erectile dysfunction following radical prostatectomy: Secondary | ICD-10-CM | POA: Diagnosis not present

## 2024-06-09 DIAGNOSIS — R9721 Rising PSA following treatment for malignant neoplasm of prostate: Secondary | ICD-10-CM | POA: Diagnosis not present

## 2024-06-13 ENCOUNTER — Encounter: Payer: Self-pay | Admitting: Urology

## 2024-06-16 ENCOUNTER — Ambulatory Visit
Admission: RE | Admit: 2024-06-16 | Discharge: 2024-06-16 | Disposition: A | Discharge status: Home / Self Care | Source: Ambulatory Visit | Attending: Urology | Admitting: Urology

## 2024-06-16 DIAGNOSIS — C61 Malignant neoplasm of prostate: Secondary | ICD-10-CM | POA: Diagnosis present

## 2024-06-16 DIAGNOSIS — R9721 Rising PSA following treatment for malignant neoplasm of prostate: Secondary | ICD-10-CM | POA: Diagnosis not present

## 2024-06-16 DIAGNOSIS — Z9079 Acquired absence of other genital organ(s): Secondary | ICD-10-CM | POA: Insufficient documentation

## 2024-06-16 DIAGNOSIS — K760 Fatty (change of) liver, not elsewhere classified: Secondary | ICD-10-CM | POA: Insufficient documentation

## 2024-06-16 MED ORDER — FLOTUFOLASTAT F 18 GALLIUM 296-5846 MBQ/ML IV SOLN
8.0000 | Freq: Once | INTRAVENOUS | Status: AC
  Administered 2024-06-16: 8.21 via INTRAVENOUS

## 2024-06-21 ENCOUNTER — Telehealth: Payer: Self-pay

## 2024-06-21 NOTE — Telephone Encounter (Signed)
 Pts wife called in wanting results from PET scan. I let her know that it had not been released from the radiologist yet but when [place], [place] got the results they would be contacted. She was anxious but I let her know someone would be in contact with her as soon as we know the results. She voiced understanding.

## 2024-06-22 ENCOUNTER — Ambulatory Visit: Payer: Self-pay | Admitting: Urology

## 2024-06-23 ENCOUNTER — Other Ambulatory Visit: Payer: Self-pay | Admitting: Urology

## 2024-06-23 DIAGNOSIS — C61 Malignant neoplasm of prostate: Secondary | ICD-10-CM

## 2024-06-23 DIAGNOSIS — R9721 Rising PSA following treatment for malignant neoplasm of prostate: Secondary | ICD-10-CM

## 2024-07-05 ENCOUNTER — Inpatient Hospital Stay

## 2024-07-07 ENCOUNTER — Inpatient Hospital Stay

## 2024-07-07 NOTE — Progress Notes (Unsigned)
 [facility] CONSULT NOTE  Patient Care Team: Lee Graham, Lee GRADE, MD as PCP - General (Family Medicine) Lee Honor BROCKS, MD (Inactive) (Internal Medicine) Lee Louanne MATSU, MD (General Surgery)  ASSESSMENT & PLAN:  Lee Graham is a 74 y.o.male with history of left ear deafness, arthritis, prostate cancer GERD, hypertension and hyperlipidemia [facility].  Current diagnosis: mHSPC with solitary lymph node metastases.  PSA 0.3. Initial diagnosis: 12/2016 radical prostatectomy pT2bcN0M0 followed by adjuvant radiation GS 3+5 with focal EPE, +right SV and microscopic bladder neck invasion.  Surgical margin negative Germline testing: Somatic testing: Treatment:  The patient was counseled on the natural history of prostate cancer and the standard treatment options that are available for prostate cancer.    He has a rise to 0.3 from 0.1 in about 6 months.  Less than detectable 1 year ago on 06/05/2023.  PET scan shows solitary lymph node metastases.  5 mm left iliac lymph node with intense radiotracer accumulation, consistent with metastatic disease. Assessment & Plan   No orders of the defined types were placed in this encounter.   Supportive baseline bone mineral density study and then every 2 years calcium (1000-1200 mg daily from food and supplements) and vitamin D3 (1000 IU daily) Zometa (5 mg IV annually) for osteopenia (T-score between -1.0 and -2.5) on ADT after dental clearance. If CRPC, 4 mg every 3 months if having bone metastases. Healthy lifestyle to prevent diabetes and CV disease Aggressive cardiovascular risk management Weight-bearing exercises (30 minutes per day) Limit alcohol consumption and avoid smoking  All questions were answered. The patient knows to call the clinic with any problems, questions or concerns. No barriers to learning was detected.  Lee Graham Chihuahua, MD 2/4/202610:16 PM  CHIEF  COMPLAINTS/PURPOSE OF CONSULTATION:  Prostate cancer  HISTORY OF PRESENTING ILLNESS:  Lee Graham 74 y.o. male is here because of prostate cancer. I have reviewed his chart and materials related to his cancer extensively and collaborated history with the patient. Summary of oncologic history is as follows: Oncology History  Prostate cancer (HCC)  12/09/2016 Initial Diagnosis   Prostate cancer [facility])  Initial diagnosis: 12/2016 radical prostatectomy pT2bcN0M0 followed by adjuvant radiation GS 3+5 GG4with focal EPE, +right SV and microscopic bladder neck invasion.  Surgical margin negative   01/11/2019 Pathology Results   BLADDER; BIOPSY:   NEGATIVE FOR MALIGNANCY.    07/02/2024 Cancer Staging   Staging form: Prostate, AJCC 8th Edition - Clinical stage from 07/02/2024: Stage IVB (rcT2b, rcN0, rcM1, PSA: 0.3, Graham Group: 4) - Signed by Lee Lee BROCKS, MD on 07/02/2024 Stage prefix: Recurrence Prostate specific antigen (PSA) range: Less than 10 Gleason score: 8 Histologic grading system: 5 Graham system     MEDICAL HISTORY:  Past Medical History:  Diagnosis Date   Arthritis    Cancer (HCC) 11/13/2016   prostate   Deafness in left ear    ONLY 30% HEARING IN RIGHT EAR   Family history of brain cancer    Family history of colon cancer    Family history of lung cancer    Family history of pancreatic cancer    Family history of prostate cancer    GERD (gastroesophageal reflux disease)    H/O   Hyperlipidemia    Hypertension     SURGICAL HISTORY: Past Surgical History:  Procedure Laterality Date   COLONOSCOPY N/A 09/03/2023   Procedure: COLONOSCOPY;  Surgeon: Tye Millet, DO;  Location: [facility] ENDOSCOPY;  Service: General;  Laterality: N/A;   COLONOSCOPY WITH PROPOFOL  N/A 08/30/2015   Procedure: COLONOSCOPY WITH PROPOFOL ;  Surgeon: Louanne KANDICE Muse, MD;  Location: [facility] ENDOSCOPY;  Service: Endoscopy;  Laterality: N/A;   COLONOSCOPY WITH PROPOFOL  N/A 08/09/2020   Procedure:  COLONOSCOPY WITH PROPOFOL ;  Surgeon: Dessa Reyes ORN, MD;  Location: [facility] ENDOSCOPY;  Service: Endoscopy;  Laterality: N/A;   CYSTOSCOPY W/ RETROGRADES Bilateral 01/11/2019   Procedure: CYSTOSCOPY WITH RETROGRADE PYELOGRAM;  Surgeon: Penne Knee, MD;  Location: [facility] ORS;  Service: Urology;  Laterality: Bilateral;   CYSTOSCOPY WITH BIOPSY N/A 01/11/2019   Procedure: CYSTOSCOPY WITH Bladder BIOPSY;  Surgeon: Penne Knee, MD;  Location: [facility] ORS;  Service: Urology;  Laterality: N/A;   FINGER SURGERY Right 2008   with skin graft    LIPOMA EXCISION Bilateral 03/13/2018   Procedure: EXCISION LIPOMA- Right  SHOULDER AND RIGHT BACK;  Surgeon: Dessa Reyes ORN, MD;  Location: [facility] ORS;  Service: General;  Laterality: Bilateral;   ROBOT ASSISTED LAPAROSCOPIC RADICAL PROSTATECTOMY N/A 12/09/2016   Procedure: ROBOTIC ASSISTED LAPAROSCOPIC RADICAL PROSTATECTOMY WITH BILATERAL PELVIC NODE DISSECTION;  Surgeon: Penne Knee, MD;  Location: [facility] ORS;  Service: Urology;  Laterality: N/A;    SOCIAL HISTORY: Social History   Socioeconomic History   Marital status: Married    Spouse name: Not on file   Number of children: Not on file   Years of education: Not on file   Highest education level: Not on file  Occupational History   Not on file  Tobacco Use   Smoking status: Former    Types: Cigars    Quit date: 09/29/1970    Years since quitting: 53.8    Passive exposure: Past   Smokeless tobacco: Former  Building Services Engineer status: Never Used  Substance and Sexual Activity   Alcohol use: No    Alcohol/week: 0.0 standard drinks of alcohol   Drug use: No   Sexual activity: Yes    Birth control/protection: None  Other Topics Concern   Not on file  Social History Narrative   Not on file   Social Drivers of Health   Tobacco Use: Medium Risk (06/13/2024)   Patient History    Smoking Tobacco Use: Former    Smokeless Tobacco Use: Former    Passive Exposure: Past  Estate Agent: Not on Ship [employer] Insecurity: Not on file  Transportation Needs: Not on file  Physical Activity: Not on file  Stress: Not on file  Social Connections: Not on file  Intimate Partner Violence: Not on file  Depression (PHQ2-9): Not on file  Alcohol Screen: Not on file  Housing: Unknown (08/20/2023)   Received from [facility]   Epic    Unable to Pay for Housing in the Last Year: Not on file    Number of Times Moved in the Last Year: Not on file    At any time in the past 12 months, were you homeless or living in a shelter (including now)?: No  Utilities: Not on file  Health Literacy: Not on file    FAMILY HISTORY: Family History  Problem Relation Age of Onset   Colon cancer Father        dx 76s   Prostate cancer Father        dx 66s   Lung cancer Father    Pancreatic cancer Mother    Colon cancer Mother    Lung cancer Mother    Brain cancer Brother  Colon cancer Brother    Prostate cancer Brother    Colon polyps Sister    Prostate cancer Brother 22   Colon cancer Brother 44   Pancreatic cancer Brother    Liver cancer Brother    Brain cancer Brother    Pancreatic cancer Brother 43   Brain cancer Brother    Cancer Brother        d. 52s   Pancreatic cancer Brother    Cancer Sister        possible male?   Cancer Maternal Aunt        unk types   Cancer Maternal Uncle        unk types   Cancer Paternal Aunt        unk types   Cancer Paternal Uncle        unk type   Cancer Maternal Grandmother        unk type   Pancreatic cancer Nephew 60   Cancer Nephew    Cancer Nephew    Cancer Nephew    Bladder Cancer Neg Hx    Kidney cancer Neg Hx     ALLERGIES:  is allergic to bactrim  [sulfamethoxazole -trimethoprim ].  MEDICATIONS:  Current Outpatient Medications  Medication Sig Dispense Refill   hydrochlorothiazide  (HYDRODIURIL ) 12.5 MG tablet Take 12.5 mg by mouth daily.     metoprolol  succinate (TOPROL -XL) 50 MG 24 hr tablet Take 50 mg  by mouth every morning.      NONFORMULARY OR COMPOUNDED ITEM Trimix (30/1/10)-(Pap/Phent/PGE)  Dosage: Inject 1 cc per injection  Vial 1 ml  Qty 5 Refills 0  [facility] [phone] Fax [phone] 1 each 3   simvastatin  (ZOCOR ) 40 MG tablet Take 40 mg by mouth daily at 6 PM.      telmisartan (MICARDIS) 20 MG tablet Take 20 mg by mouth every morning.      No current facility-administered medications for this visit.    REVIEW OF SYSTEMS:   All relevant systems were reviewed with the patient and are negative.  PHYSICAL EXAMINATION: ECOG PERFORMANCE STATUS: {CHL ONC ECOG PS:[phone]}  There were no vitals filed for this visit. There were no vitals filed for this visit.  GENERAL: alert, no distress and comfortable SKIN: skin color is normal, no jaundice, rashes EYES: sclera clear OROPHARYNX: no exudate, no erythema NECK: supple LYMPH:  no palpable lymphadenopathy in the cervical, axillary regions LUNGS: Effort normal, no respiratory distress.  Clear to auscultation bilaterally HEART: regular rate & rhythm and no lower extremity edema ABDOMEN: soft, non-tender and nondistended Musculoskeletal: no point tenderness NEURO: no focal motor/sensory deficits  LABORATORY DATA:  I have reviewed the results of PSA.  RADIOGRAPHIC STUDIES: I have personally reviewed the radiological images as listed and agreed with the findings in the report.

## 2024-07-09 ENCOUNTER — Inpatient Hospital Stay

## 2024-07-09 VITALS — BP 135/82 | HR 81 | Temp 98.2°F | Resp 17 | Ht 74.0 in | Wt 228.0 lb

## 2024-07-09 DIAGNOSIS — C61 Malignant neoplasm of prostate: Secondary | ICD-10-CM

## 2024-07-09 DIAGNOSIS — I10 Essential (primary) hypertension: Secondary | ICD-10-CM

## 2024-07-09 DIAGNOSIS — E7849 Other hyperlipidemia: Secondary | ICD-10-CM

## 2024-07-09 LAB — CBC WITH DIFFERENTIAL (CANCER CENTER ONLY)
Abs Immature Granulocytes: 0.01 10*3/uL (ref 0.00–0.07)
Basophils Absolute: 0 10*3/uL (ref 0.0–0.1)
Basophils Relative: 1 %
Eosinophils Absolute: 0 10*3/uL (ref 0.0–0.5)
Eosinophils Relative: 1 %
HCT: 39.7 % (ref 39.0–52.0)
Hemoglobin: 13.7 g/dL (ref 13.0–17.0)
Immature Granulocytes: 0 %
Lymphocytes Relative: 23 %
Lymphs Abs: 1.4 10*3/uL (ref 0.7–4.0)
MCH: 28.2 pg (ref 26.0–34.0)
MCHC: 34.5 g/dL (ref 30.0–36.0)
MCV: 81.7 fL (ref 80.0–100.0)
Monocytes Absolute: 0.4 10*3/uL (ref 0.1–1.0)
Monocytes Relative: 7 %
Neutro Abs: 4.3 10*3/uL (ref 1.7–7.7)
Neutrophils Relative %: 68 %
Platelet Count: 293 10*3/uL (ref 150–400)
RBC: 4.86 MIL/uL (ref 4.22–5.81)
RDW: 13 % (ref 11.5–15.5)
WBC Count: 6.2 10*3/uL (ref 4.0–10.5)
nRBC: 0 % (ref 0.0–0.2)

## 2024-07-09 LAB — CMP (CANCER CENTER ONLY)
ALT: 24 U/L (ref 0–44)
AST: 21 U/L (ref 15–41)
Albumin: 4.5 g/dL (ref 3.5–5.0)
Alkaline Phosphatase: 62 U/L (ref 38–126)
Anion gap: 12 (ref 5–15)
BUN: 16 mg/dL (ref 8–23)
CO2: 27 mmol/L (ref 22–32)
Calcium: 9.1 mg/dL (ref 8.9–10.3)
Chloride: 100 mmol/L (ref 98–111)
Creatinine: 1.51 mg/dL — ABNORMAL HIGH (ref 0.61–1.24)
GFR, Estimated: 48 mL/min — ABNORMAL LOW
Glucose, Bld: 118 mg/dL — ABNORMAL HIGH (ref 70–99)
Potassium: 3.8 mmol/L (ref 3.5–5.1)
Sodium: 139 mmol/L (ref 135–145)
Total Bilirubin: 0.5 mg/dL (ref 0.0–1.2)
Total Protein: 7.2 g/dL (ref 6.5–8.1)

## 2024-07-09 MED ORDER — RELUGOLIX 120 MG PO TABS: ORAL_TABLET | ORAL | 0 refills | Status: AC

## 2024-07-09 MED ORDER — RELUGOLIX 120 MG PO TABS: ORAL_TABLET | ORAL | 4 refills | Status: AC

## 2024-07-09 NOTE — Assessment & Plan Note (Signed)
 Will start relugolix  and plan for 6 months or as tolerated. Will place referral to Dr. Patrcia Follow up with us  in about 4 weeks.

## 2024-07-09 NOTE — Assessment & Plan Note (Addendum)
 Continue simvastatin 

## 2024-07-09 NOTE — Assessment & Plan Note (Addendum)
 Continue current regimen Monitor resting BP
